# Patient Record
Sex: Male | Born: 1955 | Race: White | Hispanic: No | Marital: Married | Smoking: Former smoker
Health system: Southern US, Community
[De-identification: ages and names within clinical notes are randomized; demographics above are authoritative.]

## PROBLEM LIST (undated history)

## (undated) DIAGNOSIS — Z6832 Body mass index (BMI) 32.0-32.9, adult: Secondary | ICD-10-CM

## (undated) DIAGNOSIS — J301 Allergic rhinitis due to pollen: Secondary | ICD-10-CM

## (undated) DIAGNOSIS — W57XXXA Bitten or stung by nonvenomous insect and other nonvenomous arthropods, initial encounter: Secondary | ICD-10-CM

## (undated) DIAGNOSIS — N529 Male erectile dysfunction, unspecified: Secondary | ICD-10-CM

## (undated) DIAGNOSIS — I10 Essential (primary) hypertension: Secondary | ICD-10-CM

## (undated) DIAGNOSIS — Z148 Genetic carrier of other disease: Secondary | ICD-10-CM

## (undated) DIAGNOSIS — E669 Obesity, unspecified: Secondary | ICD-10-CM

## (undated) DIAGNOSIS — D126 Benign neoplasm of colon, unspecified: Secondary | ICD-10-CM

## (undated) DIAGNOSIS — E781 Pure hyperglyceridemia: Secondary | ICD-10-CM

## (undated) DIAGNOSIS — R931 Abnormal findings on diagnostic imaging of heart and coronary circulation: Secondary | ICD-10-CM

## (undated) HISTORY — PX: OTHER SURGICAL HISTORY: SHX169

## (undated) HISTORY — DX: Genetic carrier of other disease: Z14.8

## (undated) HISTORY — DX: Pure hyperglyceridemia: E78.1

## (undated) HISTORY — DX: Benign neoplasm of colon, unspecified: D12.6

## (undated) HISTORY — DX: Abnormal findings on diagnostic imaging of heart and coronary circulation: R93.1

## (undated) HISTORY — DX: Obesity, unspecified: E66.9

## (undated) HISTORY — DX: Body mass index (BMI) 32.0-32.9, adult: Z68.32

## (undated) HISTORY — DX: Essential (primary) hypertension: I10

## (undated) HISTORY — DX: Male erectile dysfunction, unspecified: N52.9

## (undated) HISTORY — DX: Allergic rhinitis due to pollen: J30.1

---
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
## 1995-12-28 HISTORY — PX: BACK SURGERY: SHX140

## 1999-12-28 HISTORY — PX: HERNIA REPAIR: SHX51

## 2013-09-21 ENCOUNTER — Ambulatory Visit
Admission: RE | Admit: 2013-09-21 | Discharge: 2013-09-21 | Disposition: A | Discharge status: Home / Self Care | Payer: BC Managed Care – PPO | Source: Ambulatory Visit | Attending: Chiropractic Medicine | Admitting: Chiropractic Medicine

## 2013-09-21 ENCOUNTER — Other Ambulatory Visit: Payer: Self-pay | Admitting: Chiropractic Medicine

## 2013-09-21 DIAGNOSIS — M545 Low back pain, unspecified: Secondary | ICD-10-CM

## 2015-12-28 HISTORY — PX: OTHER SURGICAL HISTORY: SHX169

## 2016-06-08 DIAGNOSIS — L711 Rhinophyma: Secondary | ICD-10-CM | POA: Diagnosis not present

## 2016-06-21 DIAGNOSIS — Z01818 Encounter for other preprocedural examination: Secondary | ICD-10-CM | POA: Diagnosis not present

## 2016-06-24 DIAGNOSIS — L711 Rhinophyma: Secondary | ICD-10-CM | POA: Diagnosis not present

## 2016-08-03 DIAGNOSIS — E781 Pure hyperglyceridemia: Secondary | ICD-10-CM | POA: Diagnosis not present

## 2016-08-03 DIAGNOSIS — N529 Male erectile dysfunction, unspecified: Secondary | ICD-10-CM | POA: Diagnosis not present

## 2016-08-03 DIAGNOSIS — I10 Essential (primary) hypertension: Secondary | ICD-10-CM | POA: Diagnosis not present

## 2016-08-03 DIAGNOSIS — Z0000 Encounter for general adult medical examination without abnormal findings: Secondary | ICD-10-CM | POA: Diagnosis not present

## 2017-08-03 DIAGNOSIS — L718 Other rosacea: Secondary | ICD-10-CM | POA: Diagnosis not present

## 2017-08-03 DIAGNOSIS — L821 Other seborrheic keratosis: Secondary | ICD-10-CM | POA: Diagnosis not present

## 2017-08-05 DIAGNOSIS — Z125 Encounter for screening for malignant neoplasm of prostate: Secondary | ICD-10-CM | POA: Diagnosis not present

## 2017-08-05 DIAGNOSIS — Z0000 Encounter for general adult medical examination without abnormal findings: Secondary | ICD-10-CM | POA: Diagnosis not present

## 2017-08-05 DIAGNOSIS — E669 Obesity, unspecified: Secondary | ICD-10-CM | POA: Diagnosis not present

## 2017-08-05 DIAGNOSIS — E781 Pure hyperglyceridemia: Secondary | ICD-10-CM | POA: Diagnosis not present

## 2017-08-05 DIAGNOSIS — I10 Essential (primary) hypertension: Secondary | ICD-10-CM | POA: Diagnosis not present

## 2017-10-06 DIAGNOSIS — K644 Residual hemorrhoidal skin tags: Secondary | ICD-10-CM | POA: Diagnosis not present

## 2017-10-17 DIAGNOSIS — K641 Second degree hemorrhoids: Secondary | ICD-10-CM | POA: Diagnosis not present

## 2017-10-31 DIAGNOSIS — K641 Second degree hemorrhoids: Secondary | ICD-10-CM | POA: Diagnosis not present

## 2017-11-29 DIAGNOSIS — K641 Second degree hemorrhoids: Secondary | ICD-10-CM | POA: Diagnosis not present

## 2018-02-06 DIAGNOSIS — I10 Essential (primary) hypertension: Secondary | ICD-10-CM | POA: Diagnosis not present

## 2018-02-06 DIAGNOSIS — E669 Obesity, unspecified: Secondary | ICD-10-CM | POA: Diagnosis not present

## 2018-02-27 DIAGNOSIS — K641 Second degree hemorrhoids: Secondary | ICD-10-CM | POA: Diagnosis not present

## 2018-07-03 ENCOUNTER — Ambulatory Visit: Payer: Self-pay | Admitting: General Surgery

## 2018-07-03 DIAGNOSIS — K641 Second degree hemorrhoids: Secondary | ICD-10-CM | POA: Diagnosis not present

## 2018-07-03 NOTE — H&P (Signed)
History of Present Illness Leighton Ruff MD; 02/04/7680 3:41 PM) The patient is a 62 year old male who presents with hemorrhoids. 62 year old male who presented to the office with complaints of rectal bleeding. This had been occurring intermittently for the past 2-3 years with bowel movements. Over the past few months he has noticed more consistent bleeding with every bowel movement. This continues for 5-10 minutes and then stops. He reports regular bowel habits and denies any regular straining. His last colonoscopy was in 2014 and he is due again this year. On exam he had obvious inflammation of his right-sided hemorrhoids. He underwent rubber band ligation in Nov 2018 of the right posterior and right anterior hemorrhoids and repeat banding in Dec 2018 and again in March 2019. He has developed recurrent bleeding once again. He reports bleeding with almost every bowel movement that subsides on its own.   Problem List/Past Medical Leighton Ruff, MD; 12/31/7260 3:42 PM) PROLAPSED INTERNAL HEMORRHOIDS, GRADE 2 (K64.1)  Past Surgical History Leighton Ruff, MD; 0/02/5596 3:42 PM) Appendectomy Spinal Surgery - Lower Back Vasectomy  Diagnostic Studies History Leighton Ruff, MD; 03/28/6383 3:42 PM) Colonoscopy 1-5 years ago  Allergies Mammie Lorenzo, LPN; 04/29/6467 0:32 PM) No Known Allergies [02/27/2018]:  Medication History Mammie Lorenzo, LPN; 12/28/2480 5:00 PM) Sildenafil Citrate (20MG  Tablet, Oral) Active. Lisinopril-Hydrochlorothiazide (20-25MG  Tablet, Oral) Active. Fenofibrate (160MG  Tablet, Oral) Active. Multivitamins (Oral) Active. Vitamin D (1000UNIT Tablet, Oral) Active. Medications Reconciled  Social History Leighton Ruff, MD; 03/03/487 3:42 PM) Alcohol use Moderate alcohol use. Caffeine use Carbonated beverages, Tea. Illicit drug use Remotely quit drug use. Tobacco use Former smoker.  Family History Leighton Ruff, MD; 08/04/1693 3:42 PM) Arthritis  Mother. Melanoma Father.  Other Problems Leighton Ruff, MD; 5/0/3888 3:42 PM) Back Pain Hemorrhoids High blood pressure Umbilical Hernia Repair     Review of Systems Leighton Ruff MD; 02/03/33 3:42 PM) General Not Present- Appetite Loss, Chills, Fatigue, Fever, Night Sweats, Weight Gain and Weight Loss. Skin Not Present- Change in Wart/Mole, Dryness, Hives, Jaundice, New Lesions, Non-Healing Wounds, Rash and Ulcer. HEENT Not Present- Earache, Hearing Loss, Hoarseness, Nose Bleed, Oral Ulcers, Ringing in the Ears, Seasonal Allergies, Sinus Pain, Sore Throat, Visual Disturbances, Wears glasses/contact lenses and Yellow Eyes. Respiratory Not Present- Bloody sputum, Chronic Cough, Difficulty Breathing, Snoring and Wheezing. Breast Not Present- Breast Mass, Breast Pain, Nipple Discharge and Skin Changes. Cardiovascular Not Present- Chest Pain, Difficulty Breathing Lying Down, Leg Cramps, Palpitations, Rapid Heart Rate, Shortness of Breath and Swelling of Extremities. Gastrointestinal Present- Hemorrhoids and Rectal Pain. Not Present- Abdominal Pain, Bloating, Bloody Stool, Change in Bowel Habits, Chronic diarrhea, Constipation, Difficulty Swallowing, Excessive gas, Gets full quickly at meals, Indigestion, Nausea and Vomiting. Male Genitourinary Not Present- Blood in Urine, Change in Urinary Stream, Frequency, Impotence, Nocturia, Painful Urination, Urgency and Urine Leakage. Musculoskeletal Present- Back Pain. Not Present- Joint Pain, Joint Stiffness, Muscle Pain, Muscle Weakness and Swelling of Extremities. Neurological Not Present- Decreased Memory, Fainting, Headaches, Numbness, Seizures, Tingling, Tremor, Trouble walking and Weakness. Psychiatric Not Present- Anxiety, Bipolar, Change in Sleep Pattern, Depression, Fearful and Frequent crying. Endocrine Not Present- Cold Intolerance, Excessive Hunger, Hair Changes, Heat Intolerance and New Diabetes. Hematology Not Present- Blood  Thinners, Easy Bruising, Excessive bleeding, Gland problems, HIV and Persistent Infections.  Vitals Claiborne Billings Dockery LPN; 08/27/7914 0:56 PM) 07/03/2018 2:52 PM Weight: 234.6 lb Height: 72in Body Surface Area: 2.28 m Body Mass Index: 31.82 kg/m  Temp.: 61F(Temporal)  Pulse: 97 (Regular)  BP: 126/72 (Sitting, Left Arm, Standard)  Physical Exam Leighton Ruff MD; 05/31/4649 3:42 PM)  General Mental Status-Alert. General Appearance-Not in acute distress. Build & Nutrition-Well nourished. Posture-Normal posture. Gait-Normal.  Head and Neck Head-normocephalic, atraumatic with no lesions or palpable masses. Trachea-midline.  Chest and Lung Exam Chest and lung exam reveals -on auscultation, normal breath sounds, no adventitious sounds and normal vocal resonance.  Cardiovascular Cardiovascular examination reveals -normal heart sounds, regular rate and rhythm with no murmurs and no digital clubbing, cyanosis, edema, increased warmth or tenderness.  Abdomen Inspection Inspection of the abdomen reveals - No Hernias. Palpation/Percussion Palpation and Percussion of the abdomen reveal - Soft, Non Tender, No Rigidity (guarding), No hepatosplenomegaly and No Palpable abdominal masses.  Rectal Anorectal Exam External - skin tag. Internal - normal sphincter tone.  Neurologic Neurologic evaluation reveals -alert and oriented x 3 with no impairment of recent or remote memory, normal attention span and ability to concentrate, normal sensation and normal coordination.  Musculoskeletal Normal Exam - Bilateral-Upper Extremity Strength Normal and Lower Extremity Strength Normal.   Results Leighton Ruff MD; 03/01/4655 3:06 PM) Procedures  Name Value Date Hemorrhoids Procedure Other: Procedure: Anoscopy....Marland KitchenMarland KitchenSurgeon: Marcello Moores....Marland KitchenMarland KitchenAfter the risks and benefits were explained, verbal consent was obtained for above procedure. A medical assistant  chaperone was present thoroughout the entire procedure. ....Marland KitchenMarland KitchenAnesthesia: none....Marland KitchenMarland KitchenDiagnosis: Rectal bleeding....Marland KitchenMarland KitchenFindings: Large grade 2 left lateral internal hemorrhoid, smaller grade 2 right posterior right internal hemorrhoids. Significant external skin tags  Performed: 07/03/2018 3:02 PM    Assessment & Plan Leighton Ruff MD; 07/27/2750 3:06 PM)  PROLAPSED INTERNAL HEMORRHOIDS, GRADE 2 (K64.1) Impression: 62 year old male who presents to the office for evaluation of recurrent rectal bleeding. He is underwent rubber band ligation of his internal hemorrhoids on multiple occasions with no long-lasting effects. We have discussed this in detail. I do not think they were performing another rubber band ligation will add anything to his long-term control of rectal bleeding. I have recommended that he consider a Transhemorrhoidal dearterialization or complete hemorrhoidectomy. We have discussed the risks and benefits of this in detail. We discussed the slightly higher recurrence rate with THD versus higher recovery time with standard hemorrhoidectomy. We discussed the typical postoperative pain with each procedure in detail. We discussed that likely has either one of these procedures would resolve his bleeding are very high. I believe he is in agreement to proceed with transhemrrhoidal dearterialization.

## 2018-08-02 ENCOUNTER — Encounter (HOSPITAL_BASED_OUTPATIENT_CLINIC_OR_DEPARTMENT_OTHER): Payer: Self-pay | Admitting: *Deleted

## 2018-08-02 ENCOUNTER — Other Ambulatory Visit: Payer: Self-pay

## 2018-08-02 NOTE — Progress Notes (Signed)
Npo after midnight, arrive 630 am 08-16-18 [facility]  No meds to take  Needs istat  4 and ekg Has surgery orders in epic Driver wife Domenica Reamer cell [phone]

## 2018-08-10 DIAGNOSIS — L03312 Cellulitis of back [any part except buttock]: Secondary | ICD-10-CM | POA: Diagnosis not present

## 2018-08-14 ENCOUNTER — Encounter (HOSPITAL_BASED_OUTPATIENT_CLINIC_OR_DEPARTMENT_OTHER): Payer: Self-pay | Admitting: *Deleted

## 2018-08-14 NOTE — Progress Notes (Signed)
Pt called and lvm today , needed to add a medication.  Called and spoke w/ pt via phone , he stated he started  taking  Bactrim for infection bug bite, updated pt history and medication list.

## 2018-08-16 ENCOUNTER — Other Ambulatory Visit: Payer: Self-pay

## 2018-08-16 ENCOUNTER — Ambulatory Visit (HOSPITAL_BASED_OUTPATIENT_CLINIC_OR_DEPARTMENT_OTHER): Payer: BLUE CROSS/BLUE SHIELD | Admitting: Anesthesiology

## 2018-08-16 ENCOUNTER — Ambulatory Visit (HOSPITAL_COMMUNITY)
Admission: RE | Admit: 2018-08-16 | Discharge: 2018-08-16 | Disposition: A | Discharge status: Home / Self Care | Payer: BLUE CROSS/BLUE SHIELD | Source: Ambulatory Visit | Attending: General Surgery | Admitting: General Surgery

## 2018-08-16 ENCOUNTER — Encounter (HOSPITAL_BASED_OUTPATIENT_CLINIC_OR_DEPARTMENT_OTHER): Payer: Self-pay

## 2018-08-16 ENCOUNTER — Encounter (HOSPITAL_BASED_OUTPATIENT_CLINIC_OR_DEPARTMENT_OTHER)
Admission: RE | Disposition: A | Discharge status: Home / Self Care | Payer: Self-pay | Source: Ambulatory Visit | Attending: General Surgery

## 2018-08-16 DIAGNOSIS — I10 Essential (primary) hypertension: Secondary | ICD-10-CM | POA: Diagnosis not present

## 2018-08-16 DIAGNOSIS — K642 Third degree hemorrhoids: Secondary | ICD-10-CM | POA: Insufficient documentation

## 2018-08-16 DIAGNOSIS — Z79899 Other long term (current) drug therapy: Secondary | ICD-10-CM | POA: Insufficient documentation

## 2018-08-16 DIAGNOSIS — K641 Second degree hemorrhoids: Secondary | ICD-10-CM | POA: Diagnosis not present

## 2018-08-16 DIAGNOSIS — Z87891 Personal history of nicotine dependence: Secondary | ICD-10-CM | POA: Insufficient documentation

## 2018-08-16 HISTORY — PX: TRANSANAL HEMORRHOIDAL DEARTERIALIZATION: SHX6136

## 2018-08-16 HISTORY — DX: Essential (primary) hypertension: I10

## 2018-08-16 HISTORY — DX: Bitten or stung by nonvenomous insect and other nonvenomous arthropods, initial encounter: W57.XXXA

## 2018-08-16 LAB — POCT I-STAT 4, (NA,K, GLUC, HGB,HCT)
GLUCOSE: 109 mg/dL — AB (ref 70–99)
HEMATOCRIT: 43 % (ref 39.0–52.0)
Hemoglobin: 14.6 g/dL (ref 13.0–17.0)
POTASSIUM: 4.1 mmol/L (ref 3.5–5.1)
Sodium: 135 mmol/L (ref 135–145)

## 2018-08-16 SURGERY — TRANSANAL HEMORRHOIDAL DEARTERIALIZATION
Anesthesia: Monitor Anesthesia Care

## 2018-08-16 MED ORDER — MIDAZOLAM HCL 2 MG/2ML IJ SOLN
INTRAMUSCULAR | Status: DC | PRN
  Administered 2018-08-16 (×2): 1 mg via INTRAVENOUS

## 2018-08-16 MED ORDER — PROPOFOL 10 MG/ML IV BOLUS
INTRAVENOUS | Status: DC | PRN
  Administered 2018-08-16: 50 mg via INTRAVENOUS

## 2018-08-16 MED ORDER — FENTANYL CITRATE (PF) 100 MCG/2ML IJ SOLN
INTRAMUSCULAR | Status: DC | PRN
  Administered 2018-08-16: 50 ug via INTRAVENOUS

## 2018-08-16 MED ORDER — ACETAMINOPHEN 500 MG PO TABS
ORAL_TABLET | ORAL | Status: AC
  Filled 2018-08-16: qty 2

## 2018-08-16 MED ORDER — PROPOFOL 10 MG/ML IV BOLUS
INTRAVENOUS | Status: AC
  Filled 2018-08-16: qty 20

## 2018-08-16 MED ORDER — PROMETHAZINE HCL 25 MG/ML IJ SOLN
6.2500 mg | INTRAMUSCULAR | Status: DC | PRN
  Filled 2018-08-16: qty 1

## 2018-08-16 MED ORDER — ACETAMINOPHEN 500 MG PO TABS
1000.0000 mg | ORAL_TABLET | ORAL | Status: AC
  Administered 2018-08-16: 1000 mg via ORAL
  Filled 2018-08-16: qty 2

## 2018-08-16 MED ORDER — LIDOCAINE 5 % EX OINT
TOPICAL_OINTMENT | CUTANEOUS | Status: DC | PRN
  Administered 2018-08-16: 1

## 2018-08-16 MED ORDER — HYDROMORPHONE HCL 1 MG/ML IJ SOLN
0.2500 mg | INTRAMUSCULAR | Status: DC | PRN
  Filled 2018-08-16: qty 0.5

## 2018-08-16 MED ORDER — FENTANYL CITRATE (PF) 100 MCG/2ML IJ SOLN
INTRAMUSCULAR | Status: AC
  Filled 2018-08-16: qty 2

## 2018-08-16 MED ORDER — GABAPENTIN 300 MG PO CAPS
300.0000 mg | ORAL_CAPSULE | ORAL | Status: AC
Start: 2018-08-16 — End: 2018-08-16
  Administered 2018-08-16: 300 mg via ORAL
  Filled 2018-08-16: qty 1

## 2018-08-16 MED ORDER — BUPIVACAINE LIPOSOME 1.3 % IJ SUSP
INTRAMUSCULAR | Status: DC | PRN
  Administered 2018-08-16: 20 mL

## 2018-08-16 MED ORDER — DIAZEPAM 5 MG PO TABS: 5.0000 mg | ORAL_TABLET | Freq: Three times a day (TID) | ORAL | 0 refills | Status: AC | PRN

## 2018-08-16 MED ORDER — OXYCODONE HCL 5 MG PO TABS: 5.0000 mg | ORAL_TABLET | Freq: Four times a day (QID) | ORAL | 0 refills | Status: DC | PRN

## 2018-08-16 MED ORDER — LACTATED RINGERS IV SOLN
INTRAVENOUS | Status: DC
  Administered 2018-08-16 (×2): via INTRAVENOUS
  Filled 2018-08-16: qty 1000

## 2018-08-16 MED ORDER — SUGAMMADEX SODIUM 200 MG/2ML IV SOLN
INTRAVENOUS | Status: AC
  Filled 2018-08-16: qty 2

## 2018-08-16 MED ORDER — OXYCODONE HCL 5 MG/5ML PO SOLN
5.0000 mg | Freq: Once | ORAL | Status: DC | PRN
  Filled 2018-08-16: qty 5

## 2018-08-16 MED ORDER — BUPIVACAINE-EPINEPHRINE 0.5% -1:200000 IJ SOLN
INTRAMUSCULAR | Status: DC | PRN
  Administered 2018-08-16: 30 mL

## 2018-08-16 MED ORDER — CELECOXIB 200 MG PO CAPS
ORAL_CAPSULE | ORAL | Status: AC
  Filled 2018-08-16: qty 1

## 2018-08-16 MED ORDER — CELECOXIB 200 MG PO CAPS
200.0000 mg | ORAL_CAPSULE | ORAL | Status: AC
Start: 2018-08-16 — End: 2018-08-16
  Administered 2018-08-16: 200 mg via ORAL
  Filled 2018-08-16: qty 1

## 2018-08-16 MED ORDER — PROPOFOL 500 MG/50ML IV EMUL
INTRAVENOUS | Status: DC | PRN
  Administered 2018-08-16: 200 ug/kg/min via INTRAVENOUS

## 2018-08-16 MED ORDER — PROPOFOL 500 MG/50ML IV EMUL
INTRAVENOUS | Status: AC
  Filled 2018-08-16: qty 50

## 2018-08-16 MED ORDER — LIDOCAINE 2% (20 MG/ML) 5 ML SYRINGE
INTRAMUSCULAR | Status: AC
  Filled 2018-08-16: qty 5

## 2018-08-16 MED ORDER — MIDAZOLAM HCL 2 MG/2ML IJ SOLN
INTRAMUSCULAR | Status: AC
  Filled 2018-08-16: qty 2

## 2018-08-16 MED ORDER — BUPIVACAINE LIPOSOME 1.3 % IJ SUSP
20.0000 mL | Freq: Once | INTRAMUSCULAR | Status: DC
  Filled 2018-08-16: qty 20

## 2018-08-16 MED ORDER — OXYCODONE HCL 5 MG PO TABS
5.0000 mg | ORAL_TABLET | Freq: Once | ORAL | Status: DC | PRN
  Filled 2018-08-16: qty 1

## 2018-08-16 MED ORDER — LIDOCAINE 2% (20 MG/ML) 5 ML SYRINGE
INTRAMUSCULAR | Status: DC | PRN
  Administered 2018-08-16: 50 mg via INTRAVENOUS

## 2018-08-16 MED ORDER — GABAPENTIN 300 MG PO CAPS
ORAL_CAPSULE | ORAL | Status: AC
  Filled 2018-08-16: qty 1

## 2018-08-16 SURGICAL SUPPLY — 33 items
BLADE HEX COATED 2.75 (ELECTRODE) ×2 IMPLANT
BRIEF STRETCH FOR OB PAD LRG (UNDERPADS AND DIAPERS) ×2 IMPLANT
COVER BACK TABLE 60X90IN (DRAPES) ×2 IMPLANT
DECANTER SPIKE VIAL GLASS SM (MISCELLANEOUS) IMPLANT
DRAPE LG THREE QUARTER DISP (DRAPES) ×2 IMPLANT
DRAPE UNDERBUTTOCKS STRL (DRAPE) ×2 IMPLANT
DRSG PAD ABDOMINAL 8X10 ST (GAUZE/BANDAGES/DRESSINGS) ×2 IMPLANT
ELECT REM PT RETURN 9FT ADLT (ELECTROSURGICAL) ×2
ELECTRODE REM PT RTRN 9FT ADLT (ELECTROSURGICAL) ×1 IMPLANT
GAUZE 4X4 16PLY RFD (DISPOSABLE) ×2 IMPLANT
GAUZE SPONGE 4X4 12PLY STRL (GAUZE/BANDAGES/DRESSINGS) ×2 IMPLANT
GLOVE BIO SURGEON STRL SZ 6.5 (GLOVE) ×2 IMPLANT
GOWN STRL REUS W/TWL 2XL LVL3 (GOWN DISPOSABLE) ×2 IMPLANT
GOWN STRL REUS W/TWL XL LVL3 (GOWN DISPOSABLE) ×2 IMPLANT
HEMOSTAT SURGICEL 4X8 (HEMOSTASIS) IMPLANT
KIT SLIDE ONE PROLAPS HEMORR (KITS) IMPLANT
KIT TURNOVER CYSTO (KITS) ×2 IMPLANT
LEGGING LITHOTOMY PAIR STRL (DRAPES) ×2 IMPLANT
LUBRICANT JELLY K Y 4OZ (MISCELLANEOUS) ×2 IMPLANT
NEEDLE HYPO 22GX1.5 SAFETY (NEEDLE) ×2 IMPLANT
PACK BASIN DAY SURGERY FS (CUSTOM PROCEDURE TRAY) ×2 IMPLANT
PAD ABD 8X10 STRL (GAUZE/BANDAGES/DRESSINGS) ×2 IMPLANT
PAD ARMBOARD 7.5X6 YLW CONV (MISCELLANEOUS) IMPLANT
PENCIL BUTTON HOLSTER BLD 10FT (ELECTRODE) ×2 IMPLANT
SPONGE HEMORRHOID 8X3CM (HEMOSTASIS) IMPLANT
SUT CHROMIC 2 0 SH (SUTURE) ×2 IMPLANT
SUT CHROMIC 3 0 SH 27 (SUTURE) ×2 IMPLANT
SUT VIC AB 2-0 UR6 27 (SUTURE) IMPLANT
SYR CONTROL 10ML LL (SYRINGE) ×2 IMPLANT
TOWEL OR 17X24 6PK STRL BLUE (TOWEL DISPOSABLE) ×4 IMPLANT
TRAY DSU PREP LF (CUSTOM PROCEDURE TRAY) ×2 IMPLANT
TUBE CONNECTING 12X1/4 (SUCTIONS) ×2 IMPLANT
YANKAUER SUCT BULB TIP NO VENT (SUCTIONS) ×2 IMPLANT

## 2018-08-16 NOTE — H&P (Signed)
The patient is a 62 year old male who presents with hemorrhoids. 62 year old male who presented to the office with complaints of rectal bleeding. This had been occurring intermittently for the past 2-3 years with bowel movements. Over the past few months he has noticed more consistent bleeding with every bowel movement. This continues for 5-10 minutes and then stops. He reports regular bowel habits and denies any regular straining. His last colonoscopy was in 2014 and he is due again this year. On exam he had obvious inflammation of his right-sided hemorrhoids. He underwent rubber band ligation in Nov 2018 of the right posterior and right anterior hemorrhoids and repeat banding in Dec 2018 and again in March 2019. He has developed recurrent bleeding once again. He reports bleeding with almost every bowel movement that subsides on its own.   Problem List/Past Medical Leighton Ruff, MD; 0/08/6282 3:42 PM) PROLAPSED INTERNAL HEMORRHOIDS, GRADE 2 (K64.1)  Past Surgical History Leighton Ruff, MD; 06/01/2946 3:42 PM) Appendectomy Spinal Surgery - Lower Back Vasectomy  Diagnostic Studies History Leighton Ruff, MD; 05/31/4649 3:42 PM) Colonoscopy 1-5 years ago  Allergies Mammie Lorenzo, LPN; 03/01/4655 8:12 PM) No Known Allergies [02/27/2018]:  Medication History Mammie Lorenzo, LPN; 06/30/1699 1:74 PM) Sildenafil Citrate (20MG  Tablet, Oral) Active. Lisinopril-Hydrochlorothiazide (20-25MG  Tablet, Oral) Active. Fenofibrate (160MG  Tablet, Oral) Active. Multivitamins (Oral) Active. Vitamin D (1000UNIT Tablet, Oral) Active. Medications Reconciled  Social History Leighton Ruff, MD; 08/30/4966 3:42 PM) Alcohol use Moderate alcohol use. Caffeine use Carbonated beverages, Tea. Illicit drug use Remotely quit drug use. Tobacco use Former smoker.  Family History Leighton Ruff, MD; 05/04/1637 3:42 PM) Arthritis Mother. Melanoma Father.  Other Problems Leighton Ruff, MD;  04/01/6598 3:42 PM) Back Pain Hemorrhoids High blood pressure Umbilical Hernia Repair     Review of Systems  General Not Present- Appetite Loss, Chills, Fatigue, Fever, Night Sweats, Weight Gain and Weight Loss. Skin Not Present- Change in Wart/Mole, Dryness, Hives, Jaundice, New Lesions, Non-Healing Wounds, Rash and Ulcer. HEENT Not Present- Earache, Hearing Loss, Hoarseness, Nose Bleed, Oral Ulcers, Ringing in the Ears, Seasonal Allergies, Sinus Pain, Sore Throat, Visual Disturbances, Wears glasses/contact lenses and Yellow Eyes. Respiratory Not Present- Bloody sputum, Chronic Cough, Difficulty Breathing, Snoring and Wheezing. Breast Not Present- Breast Mass, Breast Pain, Nipple Discharge and Skin Changes. Cardiovascular Not Present- Chest Pain, Difficulty Breathing Lying Down, Leg Cramps, Palpitations, Rapid Heart Rate, Shortness of Breath and Swelling of Extremities. Gastrointestinal Present- Hemorrhoids and Rectal Pain. Not Present- Abdominal Pain, Bloating, Bloody Stool, Change in Bowel Habits, Chronic diarrhea, Constipation, Difficulty Swallowing, Excessive gas, Gets full quickly at meals, Indigestion, Nausea and Vomiting. Male Genitourinary Not Present- Blood in Urine, Change in Urinary Stream, Frequency, Impotence, Nocturia, Painful Urination, Urgency and Urine Leakage. Musculoskeletal Present- Back Pain. Not Present- Joint Pain, Joint Stiffness, Muscle Pain, Muscle Weakness and Swelling of Extremities. Neurological Not Present- Decreased Memory, Fainting, Headaches, Numbness, Seizures, Tingling, Tremor, Trouble walking and Weakness. Psychiatric Not Present- Anxiety, Bipolar, Change in Sleep Pattern, Depression, Fearful and Frequent crying. Endocrine Not Present- Cold Intolerance, Excessive Hunger, Hair Changes, Heat Intolerance and New Diabetes. Hematology Not Present- Blood Thinners, Easy Bruising, Excessive bleeding, Gland problems, HIV and Persistent Infections.  BP (!)  146/81   Pulse 88   Temp 98.4 F (36.9 C) (Oral)   Resp 16   Ht 6' (1.829 m)   Wt 105.5 kg   SpO2 97%   BMI 31.55 kg/m     Physical Exam  General Mental Status-Alert. General Appearance-Not in acute distress. Build & Nutrition-Well  nourished. Posture-Normal posture. Gait-Normal.  Head and Neck Head-normocephalic, atraumatic with no lesions or palpable masses. Trachea-midline.  Chest and Lung Exam Chest and lung exam reveals -on auscultation, normal breath sounds, no adventitious sounds and normal vocal resonance.  Cardiovascular Cardiovascular examination reveals -normal heart sounds, regular rate and rhythm with no murmurs and no digital clubbing, cyanosis, edema, increased warmth or tenderness.  Abdomen Inspection Inspection of the abdomen reveals - No Hernias. Palpation/Percussion Palpation and Percussion of the abdomen reveal - Soft, Non Tender, No Rigidity (guarding), No hepatosplenomegaly and No Palpable abdominal masses.  Rectal Anorectal Exam External - skin tag. Internal - normal sphincter tone.  Neurologic Neurologic evaluation reveals -alert and oriented x 3 with no impairment of recent or remote memory, normal attention span and ability to concentrate, normal sensation and normal coordination.  Musculoskeletal Normal Exam - Bilateral-Upper Extremity Strength Normal and Lower Extremity Strength Normal.   Other: Procedure: Anoscopy....Marland KitchenMarland KitchenSurgeon: Marcello Moores....Marland KitchenMarland KitchenAfter the risks and benefits were explained, verbal consent was obtained for above procedure. A medical assistant chaperone was present thoroughout the entire procedure. ....Marland KitchenMarland KitchenAnesthesia: none....Marland KitchenMarland KitchenDiagnosis: Rectal bleeding....Marland KitchenMarland KitchenFindings: Large grade 2 left lateral internal hemorrhoid, smaller grade 2 right posterior right internal hemorrhoids. Significant external skin tags  Performed: 07/03/2018 3:02 PM    Assessment & Plan Leighton Ruff MD;  0/12/69 3:06 PM)  PROLAPSED INTERNAL HEMORRHOIDS, GRADE 2 (K64.1) Impression: 62 year old male who presents to the office for evaluation of recurrent rectal bleeding. He is underwent rubber band ligation of his internal hemorrhoids on multiple occasions with no long-lasting effects. We have discussed this in detail. I do not think they were performing another rubber band ligation will add anything to his long-term control of rectal bleeding. I have recommended that he consider a Transhemorrhoidal dearterialization or complete hemorrhoidectomy. We have discussed the risks and benefits of this in detail. We discussed the slightly higher recurrence rate with THD versus higher recovery time with standard hemorrhoidectomy. We discussed the typical postoperative pain with each procedure in detail. We discussed that likely has either one of these procedures would resolve his bleeding are very high. I believe he is in agreement to proceed with transhemrrhoidal dearterialization.

## 2018-08-16 NOTE — Anesthesia Preprocedure Evaluation (Addendum)
Anesthesia Evaluation  Patient identified by MRN, date of birth, ID band Patient awake    Reviewed: Allergy & Precautions, NPO status , Patient's Chart, lab work & pertinent test results  Airway Mallampati: II  TM Distance: >3 FB Neck ROM: Full    Dental no notable dental hx.    Pulmonary former smoker,    Pulmonary exam normal breath sounds clear to auscultation       Cardiovascular hypertension, Pt. on medications Normal cardiovascular exam Rhythm:Regular Rate:Normal  ECG: NSR, rate 85   Neuro/Psych negative neurological ROS  negative psych ROS   GI/Hepatic negative GI ROS, Neg liver ROS,   Endo/Other  negative endocrine ROS  Renal/GU negative Renal ROS     Musculoskeletal negative musculoskeletal ROS (+)   Abdominal (+) + obese,   Peds  Hematology negative hematology ROS (+)   Anesthesia Other Findings GRADE 2 INTERNAL HEMORRHOIDS  BLEEDING  Reproductive/Obstetrics                            Anesthesia Physical Anesthesia Plan  ASA: II  Anesthesia Plan: MAC   Post-op Pain Management:    Induction: Intravenous  PONV Risk Score and Plan: 1 and Propofol infusion and Treatment may vary due to age or medical condition  Airway Management Planned: Simple Face Mask  Additional Equipment:   Intra-op Plan:   Post-operative Plan:   Informed Consent: I have reviewed the patients History and Physical, chart, labs and discussed the procedure including the risks, benefits and alternatives for the proposed anesthesia with the patient or authorized representative who has indicated his/her understanding and acceptance.   Dental advisory given  Plan Discussed with: CRNA  Anesthesia Plan Comments:         Anesthesia Quick Evaluation

## 2018-08-16 NOTE — Transfer of Care (Signed)
Immediate Anesthesia Transfer of Care Note  Patient: Travis Gomez  Procedure(s) Performed: Procedure(s) (LRB): TRANSANAL HEMORRHOIDAL DEARTERIALIZATION (N/A)  Patient Location: PACU  Anesthesia Type: MAC  Level of Consciousness: awake, alert , oriented and patient cooperative  Airway & Oxygen Therapy: Patient Spontanous Breathing and Patient connected to face mask oxygen  Post-op Assessment: Report given to PACU RN and Post -op Vital signs reviewed and stable  Post vital signs: Reviewed and stable  Complications: No apparent anesthesia complications  Last Vitals:  Vitals Value Taken Time  BP    Temp    Pulse 95 08/16/2018  9:16 AM  Resp    SpO2 97 % 08/16/2018  9:16 AM  Vitals shown include unvalidated device data.  Last Pain:  Vitals:   08/16/18 0715  TempSrc:   PainSc: 0-No pain

## 2018-08-16 NOTE — Anesthesia Postprocedure Evaluation (Signed)
Anesthesia Post Note  Patient: Travis Gomez  Procedure(s) Performed: TRANSANAL HEMORRHOIDAL DEARTERIALIZATION (N/A )     Patient location during evaluation: PACU Anesthesia Type: MAC Level of consciousness: awake and alert Pain management: pain level controlled Vital Signs Assessment: post-procedure vital signs reviewed and stable Respiratory status: spontaneous breathing, nonlabored ventilation, respiratory function stable and patient connected to nasal cannula oxygen Cardiovascular status: stable and blood pressure returned to baseline Postop Assessment: no apparent nausea or vomiting Anesthetic complications: no    Last Vitals:  Vitals:   08/16/18 0945 08/16/18 1100  BP: (!) 142/82 133/90  Pulse: 74 70  Resp: 14 14  Temp:  36.8 C  SpO2: 92% 100%    Last Pain:  Vitals:   08/16/18 1100  TempSrc:   PainSc: 0-No pain                 Calvin Chura P Broderic Bara

## 2018-08-16 NOTE — Op Note (Signed)
08/16/2018  9:22 AM  PATIENT:  Alben Deeds  62 y.o. male  Patient Care Team: Lavone Orn, MD as PCP - General (Internal Medicine)  PRE-OPERATIVE DIAGNOSIS:  BLEEDING GRADE 2 INTERNAL HEMORRHOIDS BLEEDING  POST-OPERATIVE DIAGNOSIS:  BLEEDING GRADE 2 INTERNAL HEMORRHOIDS BLEEDING  PROCEDURE: TRANSANAL HEMORRHOIDAL DEARTERIALIZATION   Surgeon(s): Leighton Ruff, MD  ASSISTANT: none   ANESTHESIA:   local and MAC  EBL: 38m  Total I/O In: 700 [I.V.:700] Out: 5 [Blood:5]  DRAINS: none   SPECIMEN:  No Specimen  DISPOSITION OF SPECIMEN:  N/A  COUNTS:  YES  PLAN OF CARE: Discharge to home after PACU  PATIENT DISPOSITION:  PACU - hemodynamically stable.  INDICATION: Patient with grade 2 bleeding internal hemorrhoids   OR FINDINGS: Grade 2 hemorrhoids, Grade 3 L lateral hemorrhoid  Description: Informed consent was confirmed. Patient underwent general anesthesia without difficulty. Patient was placed into lithotomy positioning.  The perianal region was prepped and draped in sterile fashion. Surgical time out confirmed or plan.  I did digital rectal examination and then transitioned over to anoscopy to get a sense of the anatomy.  I switched over to the TBeartooth Billings Clinicfiberoptically lit Doppler anocope.   Using the anoscope, I identified the approximate location of the arterial hemorrhoidal vessels coming in in the classic hexagonal anatomical pattern (right posterior/lateral/anterior, left posterior /lateral/anterior).    I proceeded to ligate the hemorrhoidal arteries. I used a 2-0 Vicryl suture on a UR-6 needle in a figure-of-eight fashion over the signal around 6 cm proximal to the anal verge. I then ran that stitch longitudinally more distally to the dentate line. I then tied that stitch down to cause a hemorrhoidopexy. I did that for all 6 locations.    I placed additional stiches on the left and right side for additional prolapse.  At completion of this, all hemorrhoids were reduced  into the rectum.  There was no more prolapse. External anatomy looked normal.  I repeated anoscopy and examination.   Hemostasis was good. Patient is being extubated go to recovery room.  I am about to discuss the patient's status to the family.   I have reviewed the NNeoshofor this patient.

## 2018-08-16 NOTE — Discharge Instructions (Addendum)
ANORECTAL SURGERY: POST OP INSTRUCTIONS 1. Take your usually prescribed home medications unless otherwise directed. 2. DIET: During the first few hours after surgery sip on some liquids until you are able to urinate.  It is normal to not urinate for several hours after this surgery.  If you feel uncomfortable, please contact the office for instructions.  After you are able to urinate,you may eat, if you feel like it.  Follow a light bland diet the first 24 hours after arrival home, such as soup, liquids, crackers, etc.  Be sure to include lots of fluids daily (6-8 glasses).  Avoid fast food or heavy meals, as your are more likely to get nauseated.  Eat a low fat diet the next few days after surgery.  Limit caffeine intake to 1-2 servings a day. 3. PAIN CONTROL: a. Pain is best controlled by a usual combination of several different methods TOGETHER: i. Muscle relaxation 1.  Soak in a warm bath (or Sitz bath) three times a day and after bowel movements.  Continue to do this until all pain is resolved. 2. Take the muscle relaxer (Valium) every 6 hours for the first 2 days after surgery  ii. Over the counter pain medication iii. Prescription pain medication b. Most patients will experience some swelling and discomfort in the anus/rectal area and incisions.  Heat such as warm towels, sitz baths, warm baths, etc to help relax tight/sore spots and speed recovery.  Some people prefer to use ice, especially in the first couple days after surgery, as it may decrease the pain and swelling, or alternate between ice & heat.  Experiment to what works for you.  Swelling and bruising can take several weeks to resolve.  Pain can take even longer to completely resolve. c. It is helpful to take an over-the-counter pain medication regularly for the first few weeks.  Choose one of the following that works best for you: i. Naproxen (Aleve, etc)  Two 220mg  tabs twice a day  Ibuprofen (Advil, etc) Three 200mg  tabs four times a  day (every meal & bedtime) d. A  prescription for pain medication (such as percocet, oxycodone, hydrocodone, etc) should be given to you upon discharge.  Take your pain medication as prescribed.  i. If you are having problems/concerns with the prescription medicine (does not control pain, nausea, vomiting, rash, itching, etc), please call us [phone] to see if we need to switch you to a different pain medicine that will work better for you and/or control your side effect better. ii. If you need a refill on your pain medication, please contact your pharmacy.  They will contact our office to request authorization. Prescriptions will not be filled after 5 pm or on week-ends. 4. KEEP YOUR BOWELS REGULAR and AVOID CONSTIPATION a. The goal is one to two soft bowel movements a day.  You should at least have a bowel movement every other day. b. Avoid getting constipated.  Between the surgery and the pain medications, it is common to experience some constipation. This can be very painful after rectal surgery.  Increasing fluid intake and taking a fiber supplement (such as Metamucil, Citrucel, FiberCon, etc) 1-2 times a day regularly will usually help prevent this problem from occurring.  A stool softener like colace is also recommended.  This can be purchased over the counter at your pharmacy.  You can take it up to 3 times a day.  If you do not have a bowel movement after 24 hrs since your surgery,  take one does of milk of magnesia.  If you still haven't had a bowel movement 8-12 hours after that dose, take another dose.  If you don't have a bowel movement 48 hrs after surgery, purchase a Fleets enema from the drug store and administer gently per package instructions.  If you still are having trouble with your bowel movements after that, please call the office for further instructions. c. If you develop diarrhea or have many loose bowel movements, simplify your diet to bland foods & liquids for a few days.   Stop any stool softeners and decrease your fiber supplement.  Switching to mild anti-diarrheal medications (Kayopectate, Pepto Bismol) can help.  If this worsens or does not improve, please call us.  5. Wound Care a. Remove your bandages before your first bowel movement or 8 hours after surgery.     b. Remove any wound packing material at this tim,e as well.  You do not need to repack the wound unless instructed otherwise.  Wear an absorbent pad or soft cotton gauze in your underwear to catch any drainage and help keep the area clean. You should change this every 2-3 hours while awake. c. Keep the area clean and dry.  Bathe / shower every day, especially after bowel movements.  Keep the area clean by showering / bathing over the incision / wound.   It is okay to soak an open wound to help wash it.  Wet wipes or showers / gentle washing after bowel movements is often less traumatic than regular toilet paper. d. Dennis Bast may have some styrofoam-like soft packing in the rectum which will come out with the first bowel movement.  e. You will often notice bleeding with bowel movements.  This should slow down by the end of the first week of surgery f. Expect some drainage.  This should slow down, too, by the end of the first week of surgery.  Wear an absorbent pad or soft cotton gauze in your underwear until the drainage stops. g. Do Not sit on a rubber or pillow ring.  This can make you symptoms worse.  You may sit on a soft pillow if needed.  6. ACTIVITIES as tolerated:   a. You may resume regular (light) daily activities beginning the next day--such as daily self-care, walking, climbing stairs--gradually increasing activities as tolerated.  If you can walk 30 minutes without difficulty, it is safe to try more intense activity such as jogging, treadmill, bicycling, low-impact aerobics, swimming, etc. b. Save the most intensive and strenuous activity for last such as sit-ups, heavy lifting, contact sports, etc   Refrain from any heavy lifting or straining until you are off narcotics for pain control.   c. You may drive when you are no longer taking prescription pain medication, you can comfortably sit for long periods of time, and you can safely maneuver your car and apply brakes. d. Dennis Bast may have sexual intercourse when it is comfortable.  7. FOLLOW UP in our office a. Please call [facility] at [phone] to set up an appointment to see your surgeon in the office for a follow-up appointment approximately 3-4 weeks after your surgery. b. Make sure that you call for this appointment the day you arrive home to insure a convenient appointment time. 10. IF YOU HAVE DISABILITY OR FAMILY LEAVE FORMS, BRING THEM TO THE OFFICE FOR PROCESSING.  DO NOT GIVE THEM TO YOUR DOCTOR.        Next dose Motrin,Aleve after 2:30pm  WHEN TO CALL us [phone]: 1. Poor pain control 2. Reactions / problems with new medications (rash/itching, nausea, etc)  3. Fever over 101.5 F (38.5 C) 4. Inability to urinate 5. Nausea and/or vomiting 6. Worsening swelling or bruising 7. Continued bleeding from incision. 8. Increased pain, redness, or drainage from the incision  The clinic staff is available to answer your questions during regular business hours (8:30am-5pm).  Please dont hesitate to call and ask to speak to one of our nurses for clinical concerns.   A surgeon from [facility] is always on call at the hospitals   If you have a medical emergency, go to the nearest emergency room or call 911.    [facility], [place], [place], [place],   [address] ? MAIN: [phone] ? TOLL FREE: [phone] ? FAX [phone] [email or web address]   Post Anesthesia Home Care Instructions  Activity: Get plenty of rest for the remainder of the day. A responsible individual must stay with you for 24 hours following the procedure.  For the next 24 hours, DO  NOT: -Drive a car -Paediatric nurse -Drink alcoholic beverages -Take any medication unless instructed by your physician -Make any legal decisions or sign important papers.  Meals: Start with liquid foods such as gelatin or soup. Progress to regular foods as tolerated. Avoid greasy, spicy, heavy foods. If nausea and/or vomiting occur, drink only clear liquids until the nausea and/or vomiting subsides. Call your physician if vomiting continues.  Special Instructions/Symptoms: Your throat may feel dry or sore from the anesthesia or the breathing tube placed in your throat during surgery. If this causes discomfort, gargle with warm salt water. The discomfort should disappear within 24 hours.  If you had a scopolamine patch placed behind your ear for the management of post- operative nausea and/or vomiting:  1. The medication in the patch is effective for 72 hours, after which it should be removed.  Wrap patch in a tissue and discard in the trash. Wash hands thoroughly with soap and water. 2. You may remove the patch earlier than 72 hours if you experience unpleasant side effects which may include dry mouth, dizziness or visual disturbances. 3. Avoid touching the patch. Wash your hands with soap and water after contact with the patch.

## 2018-08-17 ENCOUNTER — Encounter (HOSPITAL_BASED_OUTPATIENT_CLINIC_OR_DEPARTMENT_OTHER): Payer: Self-pay | Admitting: General Surgery

## 2018-09-28 DIAGNOSIS — I10 Essential (primary) hypertension: Secondary | ICD-10-CM | POA: Diagnosis not present

## 2018-09-28 DIAGNOSIS — E669 Obesity, unspecified: Secondary | ICD-10-CM | POA: Diagnosis not present

## 2018-09-28 DIAGNOSIS — N529 Male erectile dysfunction, unspecified: Secondary | ICD-10-CM | POA: Diagnosis not present

## 2018-09-28 DIAGNOSIS — Z148 Genetic carrier of other disease: Secondary | ICD-10-CM | POA: Diagnosis not present

## 2018-09-28 DIAGNOSIS — E781 Pure hyperglyceridemia: Secondary | ICD-10-CM | POA: Diagnosis not present

## 2018-09-28 DIAGNOSIS — Z0000 Encounter for general adult medical examination without abnormal findings: Secondary | ICD-10-CM | POA: Diagnosis not present

## 2018-09-28 DIAGNOSIS — Z23 Encounter for immunization: Secondary | ICD-10-CM | POA: Diagnosis not present

## 2018-11-10 DIAGNOSIS — R3129 Other microscopic hematuria: Secondary | ICD-10-CM | POA: Diagnosis not present

## 2019-01-29 DIAGNOSIS — D485 Neoplasm of uncertain behavior of skin: Secondary | ICD-10-CM | POA: Diagnosis not present

## 2019-01-29 DIAGNOSIS — L820 Inflamed seborrheic keratosis: Secondary | ICD-10-CM | POA: Diagnosis not present

## 2019-01-29 DIAGNOSIS — L821 Other seborrheic keratosis: Secondary | ICD-10-CM | POA: Diagnosis not present

## 2019-04-03 DIAGNOSIS — N529 Male erectile dysfunction, unspecified: Secondary | ICD-10-CM | POA: Diagnosis not present

## 2019-04-03 DIAGNOSIS — E781 Pure hyperglyceridemia: Secondary | ICD-10-CM | POA: Diagnosis not present

## 2019-04-03 DIAGNOSIS — J301 Allergic rhinitis due to pollen: Secondary | ICD-10-CM | POA: Diagnosis not present

## 2019-04-03 DIAGNOSIS — I10 Essential (primary) hypertension: Secondary | ICD-10-CM | POA: Diagnosis not present

## 2019-08-08 DIAGNOSIS — L821 Other seborrheic keratosis: Secondary | ICD-10-CM | POA: Diagnosis not present

## 2019-10-11 DIAGNOSIS — I10 Essential (primary) hypertension: Secondary | ICD-10-CM | POA: Diagnosis not present

## 2019-10-11 DIAGNOSIS — E781 Pure hyperglyceridemia: Secondary | ICD-10-CM | POA: Diagnosis not present

## 2019-10-11 DIAGNOSIS — Z0000 Encounter for general adult medical examination without abnormal findings: Secondary | ICD-10-CM | POA: Diagnosis not present

## 2019-10-11 DIAGNOSIS — Z23 Encounter for immunization: Secondary | ICD-10-CM | POA: Diagnosis not present

## 2019-10-11 DIAGNOSIS — N529 Male erectile dysfunction, unspecified: Secondary | ICD-10-CM | POA: Diagnosis not present

## 2019-10-11 DIAGNOSIS — Z125 Encounter for screening for malignant neoplasm of prostate: Secondary | ICD-10-CM | POA: Diagnosis not present

## 2019-10-17 ENCOUNTER — Other Ambulatory Visit: Payer: Self-pay | Admitting: Internal Medicine

## 2019-10-17 DIAGNOSIS — E781 Pure hyperglyceridemia: Secondary | ICD-10-CM

## 2019-10-19 ENCOUNTER — Ambulatory Visit
Admission: RE | Admit: 2019-10-19 | Discharge: 2019-10-19 | Disposition: A | Discharge status: Home / Self Care | Payer: BLUE CROSS/BLUE SHIELD | Source: Ambulatory Visit | Attending: Internal Medicine | Admitting: Internal Medicine

## 2019-10-19 DIAGNOSIS — E781 Pure hyperglyceridemia: Secondary | ICD-10-CM

## 2019-10-26 DIAGNOSIS — R931 Abnormal findings on diagnostic imaging of heart and coronary circulation: Secondary | ICD-10-CM | POA: Diagnosis not present

## 2019-10-30 DIAGNOSIS — Z03818 Encounter for observation for suspected exposure to other biological agents ruled out: Secondary | ICD-10-CM | POA: Diagnosis not present

## 2019-12-24 DIAGNOSIS — E781 Pure hyperglyceridemia: Secondary | ICD-10-CM | POA: Diagnosis not present

## 2019-12-24 DIAGNOSIS — Z5181 Encounter for therapeutic drug level monitoring: Secondary | ICD-10-CM | POA: Diagnosis not present

## 2019-12-31 ENCOUNTER — Ambulatory Visit (INDEPENDENT_AMBULATORY_CARE_PROVIDER_SITE_OTHER): Payer: BC Managed Care – PPO | Admitting: Cardiology

## 2019-12-31 ENCOUNTER — Other Ambulatory Visit: Payer: Self-pay

## 2019-12-31 ENCOUNTER — Encounter: Payer: Self-pay | Admitting: Cardiology

## 2019-12-31 VITALS — BP 130/80 | HR 77 | Ht 72.0 in | Wt 235.0 lb

## 2019-12-31 DIAGNOSIS — I10 Essential (primary) hypertension: Secondary | ICD-10-CM | POA: Diagnosis not present

## 2019-12-31 DIAGNOSIS — I2510 Atherosclerotic heart disease of native coronary artery without angina pectoris: Secondary | ICD-10-CM

## 2019-12-31 DIAGNOSIS — E782 Mixed hyperlipidemia: Secondary | ICD-10-CM

## 2019-12-31 NOTE — Patient Instructions (Signed)
Medication Instructions:  Please restart your Crestor 10 mg daily.  Continue all other medications as listed.  *If you need a refill on your cardiac medications before your next appointment, please call your pharmacy*  Testing/Procedures: Your physician has requested that you have a lexiscan myoview. For further information please visit [email or web address]. Please follow instruction sheet, as given.  Follow-Up: At [facility], you and your health needs are our priority.  As part of our continuing mission to provide you with exceptional heart care, we have created designated Provider Care Teams.  These Care Teams include your primary Cardiologist (physician) and Advanced Practice Providers (APPs -  Physician Assistants and Nurse Practitioners) who all work together to provide you with the care you need, when you need it.  Your next appointment:   3 month(s)  The format for your next appointment:   In Person  Provider:   You may see Candee Furbish, MD or one of the following Advanced Practice Providers on your designated Care Team:    Truitt Merle, NP  Cecilie Kicks, NP  Kathyrn Drown, NP   Thank you for choosing [facility]!!

## 2019-12-31 NOTE — Progress Notes (Signed)
Cardiology Office Note:    Date:  12/31/2019   ID:  Travis Gomez, DOB [birth year], MRN [id number]  PCP:  Lavone Orn, MD  Cardiologist:  Candee Furbish, MD  Electrophysiologist:  None   Referring MD: Lavone Orn, MD     History of Present Illness:    Travis Gomez is a 64 y.o. male here for the evaluation of coronary artery calcification at the request of Dr. Laurann Montana.  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  After an elevated coronary calcium score he was started on rosuvastatin 10 mg a day as well as aspirin 81 mg a day, however he is not currently taking the Crestor until he talk to me.  After taking 1 or 2 doses of Crestor 10 mg he stated that he felt achiness like a viral syndrome.  He stopped and it went away.  His calcium score was 99th percentile, score was 3846.Marland Kitchen  He is a former smoker quit in 1980, only smoked for 2 years.  Brother and sister both have hemochromatosis.  Maternal grandfather had a heart attack at age 27.  And his mother's brother also had heart issues.  His father died at age 24 from an aneurysm.  I was able to personally review his CT with him to show him the pictures of the dense calcification in his LAD as well as RCA.  He is not describing any significant anginal symptoms, no chest discomfort.  Minimal shortness of breath when traversing 3 levels of stairs at work.  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  Plans to retire next year.    Past Medical History:  Diagnosis Date  . Adenomatous polyp of colon   . Body mass index (BMI) of 32.0-32.9 in adult   . Bug bite    08-14-2018  per pt on vacation and on return 07-29-2018 noted bug bite and became red and very tender, pt urgent care started him antibiotic  . ED (erectile dysfunction)   . Elevated coronary artery calcium score   . Hemochromatosis carrier   . Hyperglyceridemia   . Hypertension   . Hypertension, essential   .  Obesity, unspecified   . Seasonal allergic rhinitis due to pollen     Past Surgical History:  Procedure Laterality Date  . APPENDECTOMY  1971  . BACK SURGERY  1997   microdiscectomt L 3 to L 4   . HEMORRHOIDAL BANDING    . HERNIA REPAIR  [id number]   umbilical   . NASAL RHINOPHYMA  2017  . TRANSANAL HEMORRHOIDAL DEARTERIALIZATION N/A 08/16/2018   Procedure: TRANSANAL HEMORRHOIDAL DEARTERIALIZATION;  Surgeon: Leighton Ruff, MD;  Location[facility];  Service: General;  Laterality: N/A;    Current Medications: Current Meds  Medication Sig  . aspirin EC 81 MG tablet Take 81 mg by mouth daily.  . cholecalciferol (VITAMIN D) 1000 units tablet Take 1,000 Units by mouth daily.  . fenofibrate 160 MG tablet Take 160 mg by mouth daily.  Marland Kitchen lisinopril-hydrochlorothiazide (PRINZIDE,ZESTORETIC) 20-25 MG tablet Take 1 tablet by mouth daily.  . Multiple Vitamins-Minerals (MULTIVITAMIN WITH MINERALS) tablet Take 1 tablet by mouth daily.  . rosuvastatin (CRESTOR) 10 MG tablet Take 10 mg by mouth daily.  . sildenafil (REVATIO) 20 MG tablet 20 mg. TAKE 2-5 TABLET BY MOUTH AS NEEDED     Allergies:   Patient has no known allergies.   Social History   Socioeconomic History  . Marital status: Married  Spouse name: Not on file  . Number of children: Not on file  . Years of education: Not on file  . Highest education level: Not on file  Occupational History  . Not on file  Tobacco Use  . Smoking status: Former Smoker    Packs/day: 3.00  . Smokeless tobacco: Never Used  . Tobacco comment: quit 1980  Substance and Sexual Activity  . Alcohol use: Yes    Comment: occ weekends  . Drug use: Never  . Sexual activity: Not on file  Other Topics Concern  . Not on file  Social History Narrative  . Not on file   Social Determinants of Health   Financial Resource Strain:   . Difficulty of Paying Living Expenses: Not on file  Food Insecurity:   . Worried About Charity fundraiser in  the Last Year: Not on file  . Ran Out of Food in the Last Year: Not on file  Transportation Needs:   . Lack of Transportation (Medical): Not on file  . Lack of Transportation (Non-Medical): Not on file  Physical Activity:   . Days of Exercise per Week: Not on file  . Minutes of Exercise per Session: Not on file  Stress:   . Feeling of Stress : Not on file  Social Connections:   . Frequency of Communication with Friends and Family: Not on file  . Frequency of Social Gatherings with Friends and Family: Not on file  . Attends Religious Services: Not on file  . Active Member of Clubs or Organizations: Not on file  . Attends Archivist Meetings: Not on file  . Marital Status: Not on file     Family History: The patient's family history includes Aneurysm in his father; Healthy in his brother, daughter, and son; Heart attack in his maternal grandfather; Hemochromatosis in his brother, mother, and sister; Liver disease in his mother.  ROS:   Please see the history of present illness.    No fevers chills nausea vomiting syncope bleeding all other systems reviewed and are negative.  EKGs/Labs/Other Studies Reviewed:    The following studies were reviewed today: Calcium score reviewed, office notes from Dr. Lupita Shutter reviewed  EKG: Normal sinus rhythm 77 with no other significant abnormalities.  Recent Labs: No results found for requested labs within last 8760 hours.  Recent Lipid Panel No results found for: CHOL, TRIG, HDL, CHOLHDL, VLDL, LDLCALC, LDLDIRECT  Physical Exam:    VS:  BP 130/80   Pulse 77   Ht 6' (1.829 m)   Wt 235 lb (106.6 kg)   SpO2 96%   BMI 31.87 kg/m     Wt Readings from Last 3 Encounters:  12/31/19 235 lb (106.6 kg)  08/16/18 232 lb 9.6 oz (105.5 kg)     GEN:  Well nourished, well developed in no acute distress HEENT: Normal NECK: No JVD; No carotid bruits LYMPHATICS: No lymphadenopathy CARDIAC: RRR, no murmurs, rubs, gallops RESPIRATORY:   Clear to auscultation without rales, wheezing or rhonchi  ABDOMEN: Soft, non-tender, non-distended MUSCULOSKELETAL:  No edema; No deformity  SKIN: Warm and dry NEUROLOGIC:  Alert and oriented x 3 PSYCHIATRIC:  Normal affect   ASSESSMENT:    1. Coronary artery calcification seen on CAT scan   2. Atherosclerosis of native coronary artery of native heart without angina pectoris   3. Mixed hyperlipidemia   4. Essential hypertension    PLAN:    In order of problems listed above:  Highly elevated coronary  calcium score/coronary atherosclerosis -99 percentile at 3846. -Agree with statin initiation 10 mg of Crestor to begin with then increase to 20 mg for high intensity dose.  Aspirin 81 mg for prevention makes sense. -He will message me if he begins to have any difficulties with the Crestor once again.  I think next step would be for him to take Crestor alone however he will likely need the fibrate because of his highly elevated triglycerides, 700-900 range at times.  If necessary, we can refer him to the pharmacy team lipid clinic. -Given his calcium score of greater than 400, we will proceed with pharmacologic stress test.  Mixed hyperlipidemia -Currently on fibrate, agree with starting Crestor again.  LDL 105 HDL 43 triglycerides 342 total cholesterol 216 and review of outside lab work.  Outside basic metabolic profile also shows creatinine of 0.96 and liver function of 15 ALT.  Essential hypertension -Currently well controlled on medications reviewed as above per Dr. Laurann Montana.  ED -Noted this about a year ago.  Sildenafil effective.  Medication Adjustments/Labs and Tests Ordered: Current medicines are reviewed at length with the patient today.  Concerns regarding medicines are outlined above.  Orders Placed This Encounter  Procedures  . Myocardial Perfusion Imaging  . EKG 12-Lead   No orders of the defined types were placed in this encounter.   Patient Instructions  Medication  Instructions:  Please restart your Crestor 10 mg daily.  Continue all other medications as listed.  *If you need a refill on your cardiac medications before your next appointment, please call your pharmacy*  Testing/Procedures: Your physician has requested that you have a lexiscan myoview. For further information please visit [email or web address]. Please follow instruction sheet, as given.  Follow-Up: At [facility], you and your health needs are our priority.  As part of our continuing mission to provide you with exceptional heart care, we have created designated Provider Care Teams.  These Care Teams include your primary Cardiologist (physician) and Advanced Practice Providers (APPs -  Physician Assistants and Nurse Practitioners) who all work together to provide you with the care you need, when you need it.  Your next appointment:   3 month(s)  The format for your next appointment:   In Person  Provider:   You may see Candee Furbish, MD or one of the following Advanced Practice Providers on your designated Care Team:    Truitt Merle, NP  Cecilie Kicks, NP  Kathyrn Drown, NP   Thank you for choosing [facility]!!        Signed, Candee Furbish, MD  12/31/2019 10:20 AM    [place]

## 2020-01-03 DIAGNOSIS — E782 Mixed hyperlipidemia: Secondary | ICD-10-CM | POA: Diagnosis not present

## 2020-01-03 DIAGNOSIS — Z5181 Encounter for therapeutic drug level monitoring: Secondary | ICD-10-CM | POA: Diagnosis not present

## 2020-01-03 DIAGNOSIS — I10 Essential (primary) hypertension: Secondary | ICD-10-CM | POA: Diagnosis not present

## 2020-01-09 ENCOUNTER — Telehealth (HOSPITAL_COMMUNITY): Payer: Self-pay | Admitting: *Deleted

## 2020-01-09 ENCOUNTER — Encounter (HOSPITAL_COMMUNITY): Payer: BC Managed Care – PPO

## 2020-01-09 NOTE — Telephone Encounter (Signed)
Patient given detailed instructions per Myocardial Perfusion Study Information Sheet for the test on 01/11/20.  Patient notified to arrive 15 minutes early and that it is imperative to arrive on time for appointment to keep from having the test rescheduled.  If you need to cancel or reschedule your appointment, please call the office within 24 hours of your appointment. . Patient verbalized understanding. Travis Gomez    

## 2020-01-11 ENCOUNTER — Other Ambulatory Visit: Payer: Self-pay

## 2020-01-11 ENCOUNTER — Ambulatory Visit (HOSPITAL_COMMUNITY): Payer: BC Managed Care – PPO | Attending: Internal Medicine

## 2020-01-11 DIAGNOSIS — I2510 Atherosclerotic heart disease of native coronary artery without angina pectoris: Secondary | ICD-10-CM | POA: Diagnosis not present

## 2020-01-11 LAB — MYOCARDIAL PERFUSION IMAGING
LV dias vol: 92 mL (ref 62–150)
LV sys vol: 38 mL
Peak HR: 105 {beats}/min
Rest HR: 69 {beats}/min
SDS: 0
SRS: 0
SSS: 0
TID: 1.01

## 2020-01-11 MED ORDER — TECHNETIUM TC 99M TETROFOSMIN IV KIT
10.4000 | PACK | Freq: Once | INTRAVENOUS | Status: AC | PRN
  Filled 2020-01-11: qty 11

## 2020-01-11 MED ORDER — TECHNETIUM TC 99M TETROFOSMIN IV KIT
32.2000 | PACK | Freq: Once | INTRAVENOUS | Status: AC | PRN
  Filled 2020-01-11: qty 33

## 2020-01-11 MED ORDER — REGADENOSON 0.4 MG/5ML IV SOLN: 0.4000 mg | Freq: Once | INTRAVENOUS | Status: AC

## 2020-02-26 IMAGING — CT CT HEART SCORING
3 series · 13 of 20 positions shown, 15 images · non-contrast
Comparison: No priors.

CLINICAL DATA: 63-year-old Caucasian male with history of
hyperlipidemia. Family history of heart disease.

EXAM:
CT HEART FOR CALCIUM SCORING
TECHNIQUE: CT heart was performed using prospective ECG gating.
A non-contrast exam for calcium scoring was performed.
Note that this exam targets the heart and the chest was not imaged
in its entirety.

[Series 2: calcium scoring 2.00 qr36 bestdiast 70% · axial · 0.48mm/px · z∈[+1522,+1578]mm · 3 of 70 slices shown]
[im 14/70  vessel]
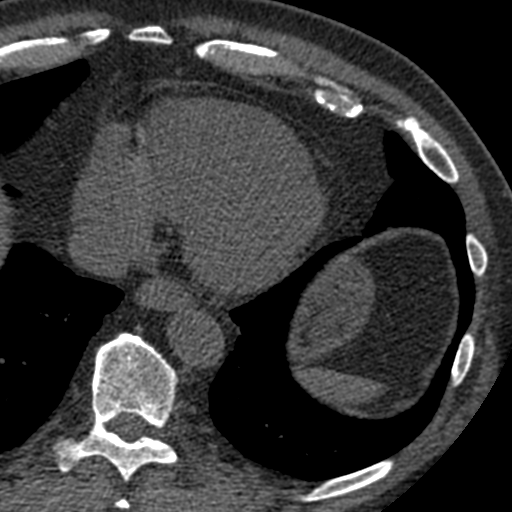
[im 28/70  vessel]
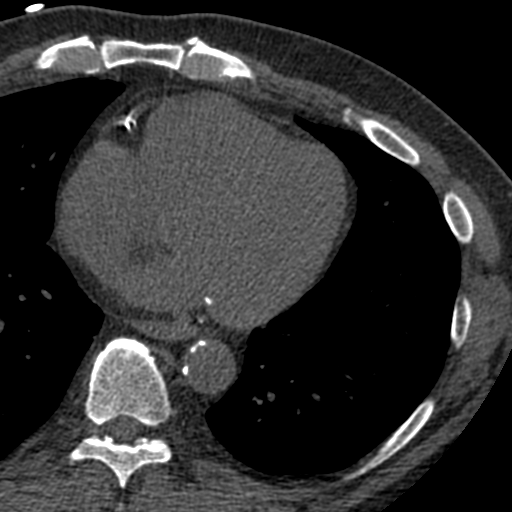
[im 42/70  vessel]
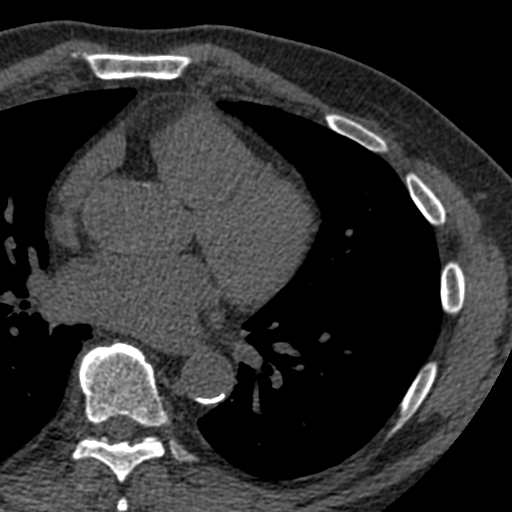

[Series 3: calcium scoring 2.00 br40 bestdiast 70% fov · axial · 0.54mm/px · z∈[+1519,+1609]mm · 5 of 69 slices shown, 7 images]
[im 12/69  vessel]
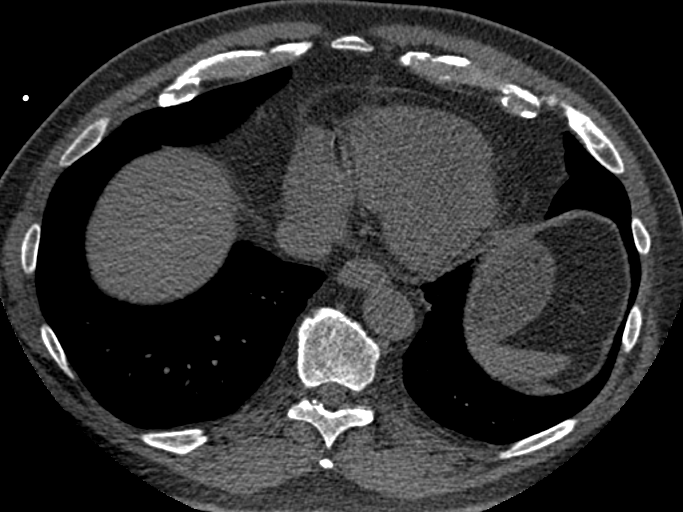
[im 12/69  lung]
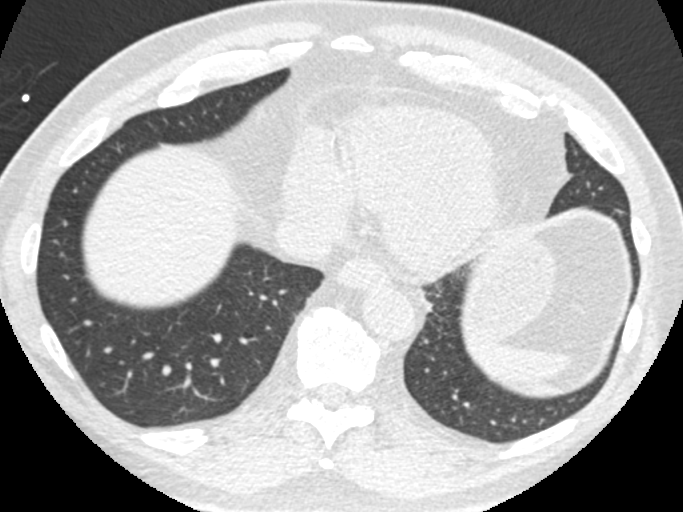
[im 23/69  vessel]
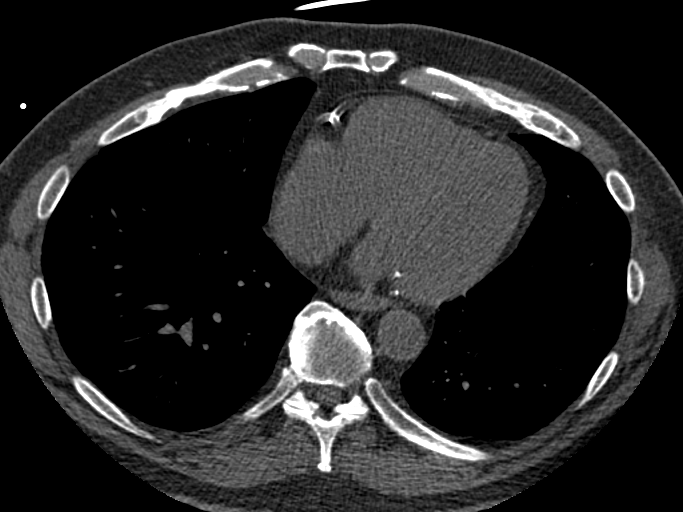
[im 35/69  vessel]
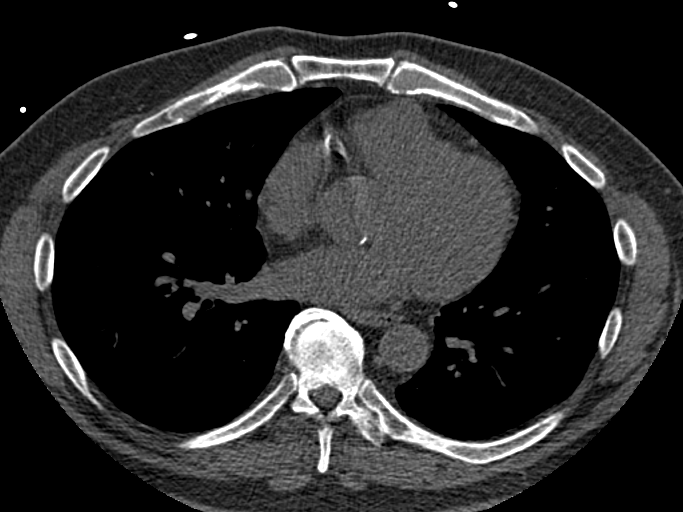
[im 46/69  vessel]
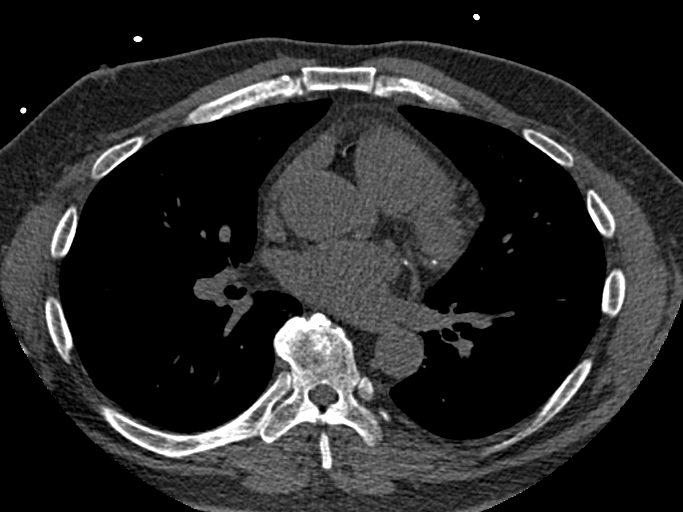
[im 57/69  vessel]
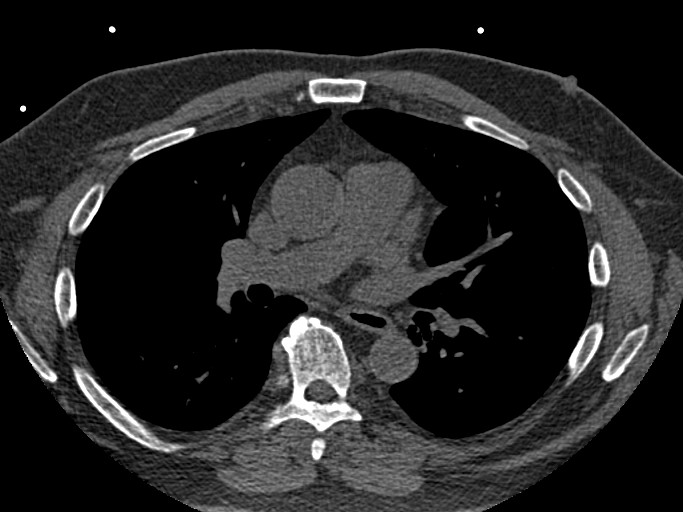
[im 57/69  lung]
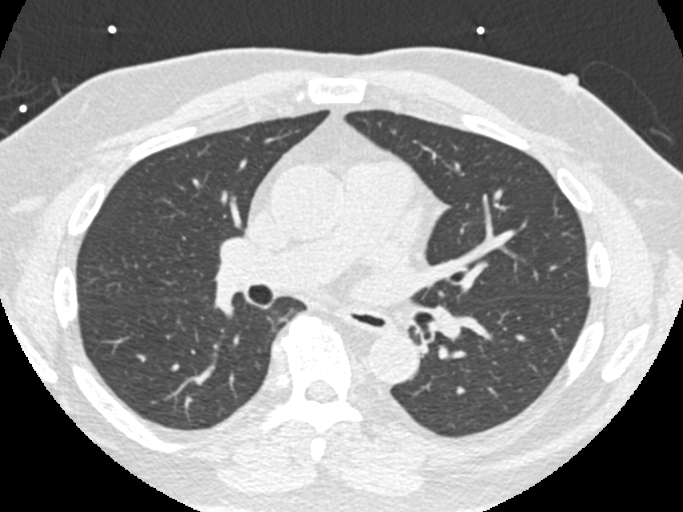

[Series 9: calcium scoring 2.00 br60 bestdiast 70% fov · axial · 0.52mm/px · z∈[+1519,+1609]mm · 5 of 69 slices shown]
[im 12/69  vessel]
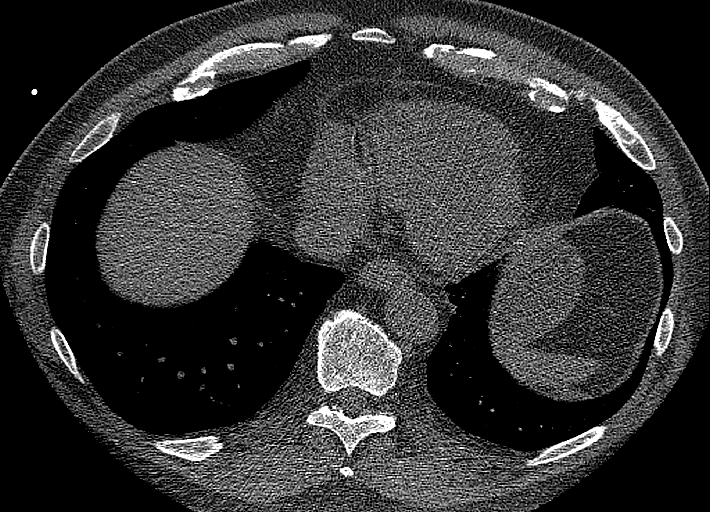
[im 23/69  vessel]
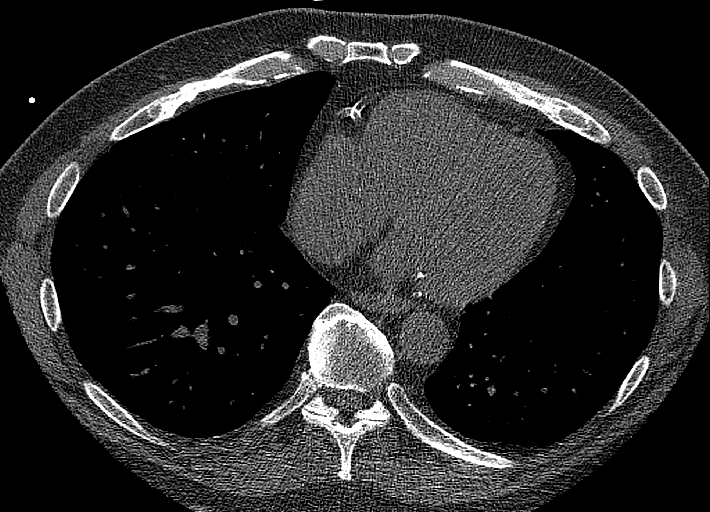
[im 35/69  vessel]
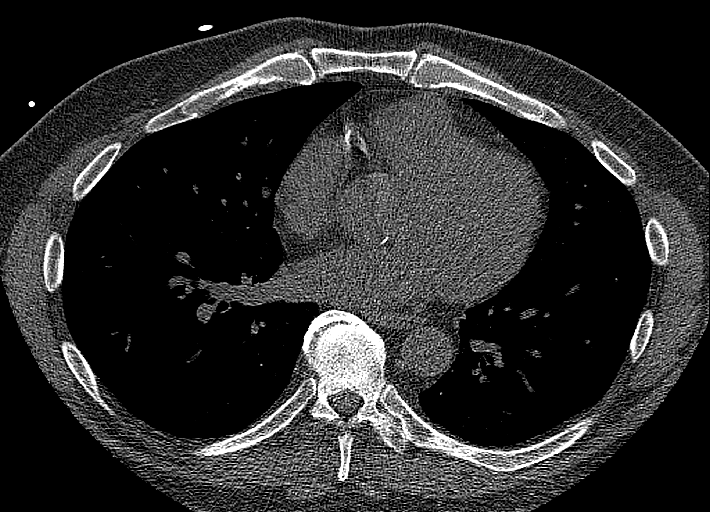
[im 46/69  vessel]
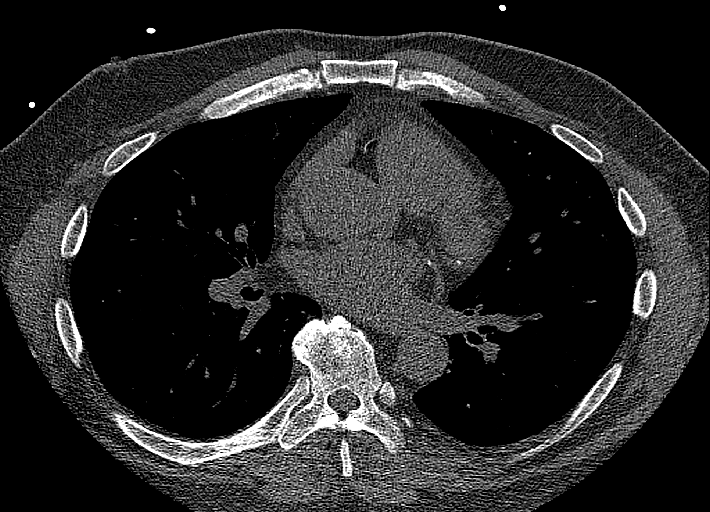
[im 57/69  vessel]
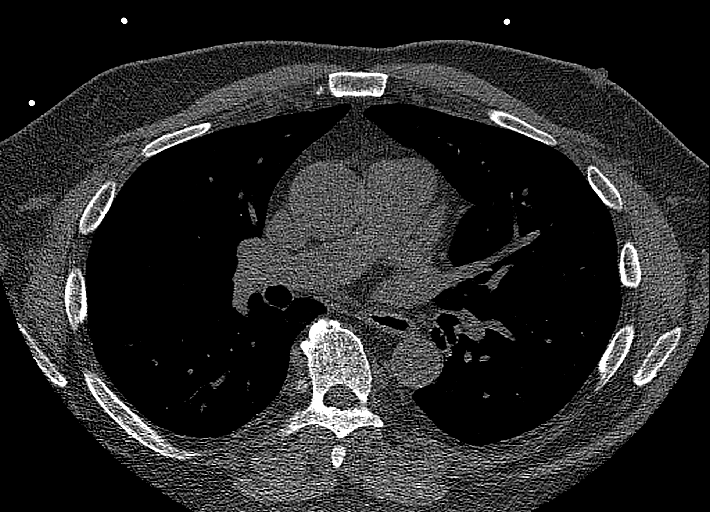

[13 of 20 positions shown; findings below may reference images not displayed]

FINDINGS: Technical quality: Good.

CORONARY CALCIUM

Total Agatston Score: 3,846

[HOSPITAL] percentile:  99th

OTHER FINDINGS:

Aortic atherosclerosis. Within the visualized portions of the thorax
there are no suspicious appearing pulmonary nodules or masses, there
is no acute consolidative airspace disease, no pleural effusions, no
pneumothorax and no lymphadenopathy. Visualized portions of the
upper abdomen are unremarkable. There are no aggressive appearing
lytic or blastic lesions noted in the visualized portions of the
skeleton.
IMPRESSION: 1. Patient's total coronary artery calcium score is 3,846 which is
98th percentile for patient's of matched age, gender and
race/ethnicity.
2.  Aortic Atherosclerosis ([id number]-[id number]).

## 2020-03-03 DIAGNOSIS — E782 Mixed hyperlipidemia: Secondary | ICD-10-CM | POA: Diagnosis not present

## 2020-04-15 DIAGNOSIS — I10 Essential (primary) hypertension: Secondary | ICD-10-CM | POA: Diagnosis not present

## 2020-04-15 DIAGNOSIS — E782 Mixed hyperlipidemia: Secondary | ICD-10-CM | POA: Diagnosis not present

## 2020-04-15 DIAGNOSIS — Z86010 Personal history of colonic polyps: Secondary | ICD-10-CM | POA: Diagnosis not present

## 2020-04-15 DIAGNOSIS — R931 Abnormal findings on diagnostic imaging of heart and coronary circulation: Secondary | ICD-10-CM | POA: Diagnosis not present

## 2020-04-15 DIAGNOSIS — Z23 Encounter for immunization: Secondary | ICD-10-CM | POA: Diagnosis not present

## 2020-04-17 ENCOUNTER — Ambulatory Visit (INDEPENDENT_AMBULATORY_CARE_PROVIDER_SITE_OTHER): Payer: BLUE CROSS/BLUE SHIELD | Admitting: Cardiology

## 2020-04-17 ENCOUNTER — Encounter: Payer: Self-pay | Admitting: Cardiology

## 2020-04-17 ENCOUNTER — Other Ambulatory Visit: Payer: Self-pay

## 2020-04-17 VITALS — BP 122/70 | HR 80 | Ht 72.0 in | Wt 232.0 lb

## 2020-04-17 DIAGNOSIS — I2510 Atherosclerotic heart disease of native coronary artery without angina pectoris: Secondary | ICD-10-CM | POA: Diagnosis not present

## 2020-04-17 DIAGNOSIS — E782 Mixed hyperlipidemia: Secondary | ICD-10-CM | POA: Diagnosis not present

## 2020-04-17 DIAGNOSIS — R011 Cardiac murmur, unspecified: Secondary | ICD-10-CM

## 2020-04-17 DIAGNOSIS — I10 Essential (primary) hypertension: Secondary | ICD-10-CM | POA: Diagnosis not present

## 2020-04-17 MED ORDER — ROSUVASTATIN CALCIUM 5 MG PO TABS: 5.0000 mg | ORAL_TABLET | Freq: Every day | ORAL | 3 refills | Status: DC

## 2020-04-17 NOTE — Patient Instructions (Signed)
Medication Instructions:   DECREASE YOUR CRESTOR (ROSUVASTATIN) TO 5 MG BY MOUTH DAILY  *If you need a refill on your cardiac medications before your next appointment, please call your pharmacy*   Testing/Procedures:  Your physician has requested that you have an echocardiogram. Echocardiography is a painless test that uses sound waves to create images of your heart. It provides your doctor with information about the size and shape of your heart and how well your heart's chambers and valves are working. This procedure takes approximately one hour. There are no restrictions for this procedure.   Follow-Up: At [facility], you and your health needs are our priority.  As part of our continuing mission to provide you with exceptional heart care, we have created designated Provider Care Teams.  These Care Teams include your primary Cardiologist (physician) and Advanced Practice Providers (APPs -  Physician Assistants and Nurse Practitioners) who all work together to provide you with the care you need, when you need it.  We recommend signing up for the patient portal called "MyChart".  Sign up information is provided on this After Visit Summary.  MyChart is used to connect with patients for Virtual Visits (Telemedicine).  Patients are able to view lab/test results, encounter notes, upcoming appointments, etc.  Non-urgent messages can be sent to your provider as well.   To learn more about what you can do with MyChart, go to [email or web address].    Your next appointment:   12 month(s)  The format for your next appointment:   In Person  Provider:   Candee Furbish, MD

## 2020-04-17 NOTE — Progress Notes (Signed)
Cardiology Office Note:    Date:  04/17/2020   ID:  Travis Gomez, DOB [birth year], MRN [id number]  PCP:  Lavone Orn, MD  Cardiologist:  Candee Furbish, MD  Electrophysiologist:  None   Referring MD: Lavone Orn, MD     History of Present Illness:    Travis Gomez is a 64 y.o. male here for the follow-up of coronary artery calcification and hypertriglyceridemia baseline 800 triglycerides.  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  Calcium score was 99th percentile at 3800.  Brother and sister both have hemochromatosis.  Maternal grandfather had heart attack at age 29.  Father died age 42 from an aneurysm.  After taking 1 or 2 doses of Crestor 10 he stated that he felt some achiness like a viral syndrome.  He stopped it and it went away.  Previously showed him the pictures of the CT scan with dense calcification in the LAD and RCA.  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  Enjoying retired life.  Recently came back from [place].  Vaccinated for [employer].  Overall doing well without any anginal symptoms.    Past Medical History:  Diagnosis Date  . Adenomatous polyp of colon   . Body mass index (BMI) of 32.0-32.9 in adult   . Bug bite    08-14-2018  per pt on vacation and on return 07-29-2018 noted bug bite and became red and very tender, pt urgent care started him antibiotic  . ED (erectile dysfunction)   . Elevated coronary artery calcium score   . Hemochromatosis carrier   . Hyperglyceridemia   . Hypertension   . Hypertension, essential   . Obesity, unspecified   . Seasonal allergic rhinitis due to pollen     Past Surgical History:  Procedure Laterality Date  . APPENDECTOMY  1971  . BACK SURGERY  1997   microdiscectomt L 3 to L 4   . HEMORRHOIDAL BANDING    . HERNIA REPAIR  99991111   umbilical   . NASAL RHINOPHYMA  2017  . TRANSANAL HEMORRHOIDAL DEARTERIALIZATION N/A 08/16/2018   Procedure: TRANSANAL HEMORRHOIDAL DEARTERIALIZATION;  Surgeon: Leighton Ruff, MD;   [facility];  Service: General;  Laterality: N/A;    Current Medications: Current Meds  Medication Sig  . aspirin EC 81 MG tablet Take 81 mg by mouth daily.  . cholecalciferol (VITAMIN D) 1000 units tablet Take 1,000 Units by mouth daily.  . fenofibrate 160 MG tablet Take 160 mg by mouth daily.  Marland Kitchen lisinopril-hydrochlorothiazide (PRINZIDE,ZESTORETIC) 20-25 MG tablet Take 1 tablet by mouth daily.  . Multiple Vitamins-Minerals (MULTIVITAMIN WITH MINERALS) tablet Take 1 tablet by mouth daily.  . sildenafil (REVATIO) 20 MG tablet 20 mg. TAKE 2-5 TABLET BY MOUTH AS NEEDED  . [DISCONTINUED] rosuvastatin (CRESTOR) 10 MG tablet Take 10 mg by mouth daily.     Allergies:   Patient has no known allergies.   Social History   Socioeconomic History  . Marital status: Married    Spouse name: Not on file  . Number of children: Not on file  . Years of education: Not on file  . Highest education level: Not on file  Occupational History  . Not on file  Tobacco Use  . Smoking status: Former Smoker    Packs/day: 3.00  . Smokeless tobacco: Never Used  . Tobacco comment: quit 1980  Substance and Sexual Activity  . Alcohol use: Yes    Comment: occ weekends  . Drug use: Never  . Sexual activity: Not on  file  Other Topics Concern  . Not on file  Social History Narrative  . Not on file   Social Determinants of Health   Financial Resource Strain:   . Difficulty of Paying Living Expenses:   Food Insecurity:   . Worried About Charity fundraiser in the Last Year:   . Arboriculturist in the Last Year:   Transportation Needs:   . Film/video editor (Medical):   Marland Kitchen Lack of Transportation (Non-Medical):   Physical Activity:   . Days of Exercise per Week:   . Minutes of Exercise per Session:   Stress:   . Feeling of Stress :   Social Connections:   . Frequency of Communication with Friends and Family:   . Frequency of Social Gatherings with Friends and Family:   .  Attends Religious Services:   . Active Member of Clubs or Organizations:   . Attends Archivist Meetings:   Marland Kitchen Marital Status:      Family History: The patient's family history includes Aneurysm in his father; Healthy in his brother, daughter, and son; Heart attack in his maternal grandfather; Hemochromatosis in his brother, mother, and sister; Liver disease in his mother.  ROS:   Please see the history of present illness.     All other systems reviewed and are negative.  EKGs/Labs/Other Studies Reviewed:    The following studies were reviewed today: Nuclear stress test reviewed with him-low risk no ischemia  EKG:  EKG is not ordered today.    Recent Labs: No results found for requested labs within last 8760 hours.  Recent Lipid Panel No results found for: CHOL, TRIG, HDL, CHOLHDL, VLDL, LDLCALC, LDLDIRECT  Physical Exam:    VS:  BP 122/70   Pulse 80   Ht 6' (1.829 m)   Wt 232 lb (105.2 kg)   SpO2 98%   BMI 31.46 kg/m     Wt Readings from Last 3 Encounters:  04/17/20 232 lb (105.2 kg)  01/11/20 235 lb (106.6 kg)  12/31/19 235 lb (106.6 kg)     GEN:  Well nourished, well developed in no acute distress HEENT: Normal NECK: No JVD; No carotid bruits LYMPHATICS: No lymphadenopathy CARDIAC: RRR, 1/6 systolic murmur, rubs, gallops RESPIRATORY:  Clear to auscultation without rales, wheezing or rhonchi  ABDOMEN: Soft, non-tender, non-distended MUSCULOSKELETAL:  No edema; No deformity  SKIN: Warm and dry NEUROLOGIC:  Alert and oriented x 3 PSYCHIATRIC:  Normal affect   ASSESSMENT:    1. Coronary artery calcification seen on CAT scan   2. Atherosclerosis of native coronary artery of native heart without angina pectoris   3. Essential hypertension   4. Mixed hyperlipidemia   5. Murmur    PLAN:    In order of problems listed above:  Elevated coronary calcium score/coronary atherosclerosis -99th percentile 3800 -Crestor 5 mg.  Triglycerides 180 now on  fibrate as well.  States that his LDL was 87 at Dr. Delene Ruffini.  Stress test reviewed with him. -Not having any symptoms.  Continue with aggressive medical management.  Explained to him clearly that if symptoms do occur that we would have low threshold for coronary angiography.  Heart murmur -We will check echocardiogram.  Sister recently had valve surgery he states.  Mixed hyperlipidemia -Continue with Crestor 5 mg.  Seems to be tolerating this well.  Had some trouble with higher dosing.  Also on fibrate.  Continue.  Triglycerides are the best they have been in a while.  Continue to advocate for dietary weight loss.  Essential hypertension -Currently controlled on medications as above.  Dr. Laurann Montana.  Medication Adjustments/Labs and Tests Ordered: Current medicines are reviewed at length with the patient today.  Concerns regarding medicines are outlined above.  Orders Placed This Encounter  Procedures  . ECHOCARDIOGRAM COMPLETE   Meds ordered this encounter  Medications  . rosuvastatin (CRESTOR) 5 MG tablet    Sig: Take 1 tablet (5 mg total) by mouth daily.    Dispense:  90 tablet    Refill:  3    decreased    Patient Instructions  Medication Instructions:   DECREASE YOUR CRESTOR (ROSUVASTATIN) TO 5 MG BY MOUTH DAILY  *If you need a refill on your cardiac medications before your next appointment, please call your pharmacy*   Testing/Procedures:  Your physician has requested that you have an echocardiogram. Echocardiography is a painless test that uses sound waves to create images of your heart. It provides your doctor with information about the size and shape of your heart and how well your heart's chambers and valves are working. This procedure takes approximately one hour. There are no restrictions for this procedure.   Follow-Up: At [facility], you and your health needs are our priority.  As part of our continuing mission to provide you with exceptional heart care, we  have created designated Provider Care Teams.  These Care Teams include your primary Cardiologist (physician) and Advanced Practice Providers (APPs -  Physician Assistants and Nurse Practitioners) who all work together to provide you with the care you need, when you need it.  We recommend signing up for the patient portal called "MyChart".  Sign up information is provided on this After Visit Summary.  MyChart is used to connect with patients for Virtual Visits (Telemedicine).  Patients are able to view lab/test results, encounter notes, upcoming appointments, etc.  Non-urgent messages can be sent to your provider as well.   To learn more about what you can do with MyChart, go to [email or web address].    Your next appointment:   12 month(s)  The format for your next appointment:   In Person  Provider:   Candee Furbish, MD        Signed, Candee Furbish, MD  04/17/2020 9:12 AM    [place]

## 2020-04-29 ENCOUNTER — Other Ambulatory Visit: Payer: Self-pay

## 2020-04-29 ENCOUNTER — Ambulatory Visit (HOSPITAL_COMMUNITY): Payer: BLUE CROSS/BLUE SHIELD | Attending: Cardiology

## 2020-04-29 DIAGNOSIS — R011 Cardiac murmur, unspecified: Secondary | ICD-10-CM | POA: Diagnosis not present

## 2020-04-30 ENCOUNTER — Telehealth: Payer: Self-pay | Admitting: Cardiology

## 2020-04-30 NOTE — Telephone Encounter (Signed)
The patient has been notified of the result and verbalized understanding.  All questions (if any) were answered. Antonieta Iba, RN 04/30/2020 2:24 PM

## 2020-04-30 NOTE — Telephone Encounter (Signed)
New message  Patient is returning call for echo results. Please give patient a call back to assist.

## 2020-08-05 DIAGNOSIS — D123 Benign neoplasm of transverse colon: Secondary | ICD-10-CM | POA: Diagnosis not present

## 2020-08-05 DIAGNOSIS — Z86010 Personal history of colonic polyps: Secondary | ICD-10-CM | POA: Diagnosis not present

## 2020-10-13 DIAGNOSIS — Z1159 Encounter for screening for other viral diseases: Secondary | ICD-10-CM | POA: Diagnosis not present

## 2020-10-13 DIAGNOSIS — I10 Essential (primary) hypertension: Secondary | ICD-10-CM | POA: Diagnosis not present

## 2020-10-13 DIAGNOSIS — E782 Mixed hyperlipidemia: Secondary | ICD-10-CM | POA: Diagnosis not present

## 2020-10-13 DIAGNOSIS — Z0000 Encounter for general adult medical examination without abnormal findings: Secondary | ICD-10-CM | POA: Diagnosis not present

## 2020-10-13 DIAGNOSIS — Z23 Encounter for immunization: Secondary | ICD-10-CM | POA: Diagnosis not present

## 2020-10-13 DIAGNOSIS — N529 Male erectile dysfunction, unspecified: Secondary | ICD-10-CM | POA: Diagnosis not present

## 2020-10-13 DIAGNOSIS — I2510 Atherosclerotic heart disease of native coronary artery without angina pectoris: Secondary | ICD-10-CM | POA: Diagnosis not present

## 2020-11-24 DIAGNOSIS — Z20822 Contact with and (suspected) exposure to covid-19: Secondary | ICD-10-CM | POA: Diagnosis not present

## 2021-07-08 ENCOUNTER — Other Ambulatory Visit: Payer: Self-pay | Admitting: *Deleted

## 2021-07-08 MED ORDER — ROSUVASTATIN CALCIUM 5 MG PO TABS: 5.0000 mg | ORAL_TABLET | Freq: Every day | ORAL | 0 refills | Status: DC

## 2021-07-30 ENCOUNTER — Ambulatory Visit (INDEPENDENT_AMBULATORY_CARE_PROVIDER_SITE_OTHER): Payer: BLUE CROSS/BLUE SHIELD | Admitting: Cardiology

## 2021-07-30 ENCOUNTER — Encounter (HOSPITAL_BASED_OUTPATIENT_CLINIC_OR_DEPARTMENT_OTHER): Payer: Self-pay | Admitting: Cardiology

## 2021-07-30 ENCOUNTER — Other Ambulatory Visit: Payer: Self-pay

## 2021-07-30 VITALS — BP 120/70 | HR 72 | Ht 72.0 in | Wt 228.0 lb

## 2021-07-30 DIAGNOSIS — I2510 Atherosclerotic heart disease of native coronary artery without angina pectoris: Secondary | ICD-10-CM

## 2021-07-30 DIAGNOSIS — E782 Mixed hyperlipidemia: Secondary | ICD-10-CM | POA: Diagnosis not present

## 2021-07-30 NOTE — Patient Instructions (Signed)
Medication Instructions:  The current medical regimen is effective;  continue present plan and medications.  *If you need a refill on your cardiac medications before your next appointment, please call your pharmacy*  You have been referred to the [facility] at [address], [address].  You will be contacted to be scheduled.  Follow-Up: At [facility], you and your health needs are our priority.  As part of our continuing mission to provide you with exceptional heart care, we have created designated Provider Care Teams.  These Care Teams include your primary Cardiologist (physician) and Advanced Practice Providers (APPs -  Physician Assistants and Nurse Practitioners) who all work together to provide you with the care you need, when you need it.  We recommend signing up for the patient portal called "MyChart".  Sign up information is provided on this After Visit Summary.  MyChart is used to connect with patients for Virtual Visits (Telemedicine).  Patients are able to view lab/test results, encounter notes, upcoming appointments, etc.  Non-urgent messages can be sent to your provider as well.   To learn more about what you can do with MyChart, go to [email or web address].    Your next appointment:   Follow up with Dr Marlou Porch in 1 year.  Thank you for choosing [facility]!!

## 2021-07-30 NOTE — Progress Notes (Signed)
Cardiology Office Note:    Date:  07/30/2021   ID:  Travis Gomez, DOB 1956/01/17, MRN [id number]  PCP:  Lavone Orn, MD   [facility] Providers Cardiologist:  Candee Furbish, MD     Referring MD: Lavone Orn, MD    History of Present Illness:    Travis Gomez is a 65 y.o. 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  His coronary calcium score was 99 percentile at 3800.  His maternal grandfather had a heart attack at age 33.  Father died at age 48 from an aneurysm.  After taking 1-2 doses of Crestor 10 mg he felt achiness like a viral syndrome.  He stopped it and then went away.  Dense calcifications noted in the LAD and RCA.  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  Feeling little more winded in the heat. No CP  Past Medical History:  Diagnosis Date   Adenomatous polyp of colon    Body mass index (BMI) of 32.0-32.9 in adult    Bug bite    08-14-2018  per pt on vacation and on return 07-29-2018 noted bug bite and became red and very tender, pt urgent care started him antibiotic   ED (erectile dysfunction)    Elevated coronary artery calcium score    Hemochromatosis carrier    Hyperglyceridemia    Hypertension    Hypertension, essential    Obesity, unspecified    Seasonal allergic rhinitis due to pollen     Past Surgical History:  Procedure Laterality Date   [place]   microdiscectomt L 3 to L [place]  99991111   umbilical    NASAL RHINOPHYMA  2017   TRANSANAL HEMORRHOIDAL DEARTERIALIZATION N/A 08/16/2018   Procedure: TRANSANAL HEMORRHOIDAL DEARTERIALIZATION;  Surgeon: Leighton Ruff, MD;  Location: [facility];  Service: General;  Laterality: N/A;    Current Medications: Current Meds  Medication Sig   aspirin EC 81 MG tablet Take 81 mg by  mouth daily.   cholecalciferol (VITAMIN D) 1000 units tablet Take 1,000 Units by mouth daily.   fenofibrate 160 MG tablet Take 160 mg by mouth daily.   lisinopril-hydrochlorothiazide (PRINZIDE,ZESTORETIC) 20-25 MG tablet Take 1 tablet by mouth daily.   Multiple Vitamins-Minerals (MULTIVITAMIN WITH MINERALS) tablet Take 1 tablet by mouth daily.   rosuvastatin (CRESTOR) 5 MG tablet Take 1 tablet (5 mg total) by mouth daily. PLEASE HAVE PT KEEP SCHEDULED APPT FOR FUTURE REFILLS   sildenafil (REVATIO) 20 MG tablet 20 mg. TAKE 2-5 TABLET BY MOUTH AS NEEDED     Allergies:   Patient has no known allergies.   Social History   Socioeconomic History   Marital status: Married    Spouse name: Not on file   Number of children: Not on file   Years of education: Not on file   Highest education level: Not on file  Occupational History   Not on file  Tobacco Use   Smoking status: Former    Packs/day: 3.00    Types: Cigarettes   Smokeless tobacco: Never   Tobacco comments:    quit 1980  Vaping Use   Vaping Use: Never used  Substance and Sexual Activity   Alcohol use: Yes    Comment: occ weekends   Drug use: Never   Sexual activity: Not on file  Other Topics Concern   Not on file  Social History Narrative   Not on file   Social Determinants of Health   Financial Resource Strain: Not on file  Food Insecurity: Not on file  Transportation Needs: Not on file  Physical Activity: Not on file  Stress: Not on file  Social Connections: Not on file     Family History: The patient's family history includes Aneurysm in his father; Healthy in his brother, daughter, and son; Heart attack in his maternal grandfather; Hemochromatosis in his brother, mother, and sister; Liver disease in his mother.  ROS:   Please see the history of present illness.     All other systems reviewed and are negative.  EKGs/Labs/Other Studies Reviewed:    The following studies were reviewed today:  ECHO 2021:    1. Left ventricular ejection fraction, by estimation, is 60 to 65%. The  left ventricle has normal function. The left ventricle has no regional  wall motion abnormalities. There is mild left ventricular hypertrophy.  Left ventricular diastolic parameters  are consistent with Grade I diastolic dysfunction (impaired relaxation).  The average left ventricular global longitudinal strain is -22.3 %. The  global longitudinal strain is normal.   2. Right ventricular systolic function is normal. The right ventricular  size is normal.   3. The mitral valve is normal in structure. Mild mitral valve  regurgitation. No evidence of mitral stenosis.   4. The aortic valve is normal in structure. Aortic valve regurgitation is  not visualized. Mild aortic valve sclerosis is present, with no evidence  of aortic valve stenosis.   5. Aortic dilatation noted. 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   6. The inferior vena cava is normal in size with greater than 50%  respiratory variability, suggesting right atrial pressure of 3 mmHg.  EKG:  EKG is  ordered today.  The ekg ordered today demonstrates NSR 72,   Recent Labs: No results found for requested labs within last 8760 hours.  Recent Lipid Panel No results found for: CHOL, TRIG, HDL, CHOLHDL, VLDL, LDLCALC, LDLDIRECT   Risk Assessment/Calculations:          Physical Exam:    VS:  BP 120/70 (BP Location: Left Arm, Patient Position: Sitting, Cuff Size: Normal)   Pulse 72   Ht 6' (1.829 m)   Wt 228 lb (103.4 kg)   SpO2 95%   BMI 30.92 kg/m     Wt Readings from Last 3 Encounters:  07/30/21 228 lb (103.4 kg)  04/17/20 232 lb (105.2 kg)  01/11/20 235 lb (106.6 kg)     GEN:  Well nourished, well developed in no acute distress HEENT: Normal NECK: No JVD; No carotid bruits LYMPHATICS: No lymphadenopathy CARDIAC: RRR, no murmurs, rubs, gallops RESPIRATORY:  Clear to auscultation without rales,  wheezing or rhonchi  ABDOMEN: Soft, non-tender, non-distended MUSCULOSKELETAL:  No edema; No deformity  SKIN: Warm and dry NEUROLOGIC:  Alert and oriented x 3 PSYCHIATRIC:  Normal affect   ASSESSMENT:    1. Mixed hyperlipidemia   2. Coronary artery calcification seen on CAT scan   3. Atherosclerosis of native coronary artery of native heart without angina pectoris    PLAN:    In order of problems listed above:  Coronary calcium score - 99 percentile 3800.  Stress test Overall reassuring perfusion scan with no ischemia identified.  Normal pumpfunction. Continue with aggressive risk factor modification given his coronarycalcification. -I  will have him touch base with the lipid clinic pharmacy team to discuss potential options to lower his LDL and 50 given his highly elevated coronary calcium score.  Lengthy conversation with he and his wife.  Injectables such as PCSK9 inhibitors may be a good option for him.  1 to be as aggressive as possible to try to minimize his risk over the next 10 years. - Obviously if he begins to have any worsening symptoms of shortness of breath or chest pain, we may directly proceed with coronary angiography.  We discussed this with him.  Wife was present as well. - Continue with aspirin 81 mg  Mixed hyperlipidemia - Continue with Crestor 5 mg once a day.  He does not tolerate any dosage above that. -Could try coenzyme every 10. - With his hypertriglyceridemia he is on fenofibrate.  Baseline at times have been greater than 800.  He is now 400.  I would like for him to discuss with the pharmacy team Vascepa.  He will let me know if any symptoms change. 41 minutes spent with review of patient's material data, discussion with patient and his wife, demonstrating nuclear stress test films as well as CT calcium score.  Candee Furbish, MD         Medication Adjustments/Labs and Tests Ordered: Current medicines are reviewed at length with the patient today.   Concerns regarding medicines are outlined above.  Orders Placed This Encounter  Procedures   AMB Referral to [facility]   EKG 12-Lead   No orders of the defined types were placed in this encounter.   Patient Instructions  Medication Instructions:  The current medical regimen is effective;  continue present plan and medications.  *If you need a refill on your cardiac medications before your next appointment, please call your pharmacy*  You have been referred to the [facility] at [address].  You will be contacted to be scheduled.  Follow-Up: At [facility], you and your health needs are our priority.  As part of our continuing mission to provide you with exceptional heart care, we have created designated Provider Care Teams.  These Care Teams include your primary Cardiologist (physician) and Advanced Practice Providers (APPs -  Physician Assistants and Nurse Practitioners) who all work together to provide you with the care you need, when you need it.  We recommend signing up for the patient portal called "MyChart".  Sign up information is provided on this After Visit Summary.  MyChart is used to connect with patients for Virtual Visits (Telemedicine).  Patients are able to view lab/test results, encounter notes, upcoming appointments, etc.  Non-urgent messages can be sent to your provider as well.   To learn more about what you can do with MyChart, go to [email or web address].    Your next appointment:   Follow up with Dr Marlou Porch in 1 year.  Thank you for choosing [facility]!!     Signed, Candee Furbish, MD  07/30/2021 5:24 PM    [facility]

## 2021-08-17 ENCOUNTER — Ambulatory Visit (INDEPENDENT_AMBULATORY_CARE_PROVIDER_SITE_OTHER): Payer: BLUE CROSS/BLUE SHIELD | Admitting: Pharmacist

## 2021-08-17 ENCOUNTER — Other Ambulatory Visit: Payer: Self-pay

## 2021-08-17 ENCOUNTER — Telehealth: Payer: Self-pay | Admitting: Pharmacist

## 2021-08-17 DIAGNOSIS — I2510 Atherosclerotic heart disease of native coronary artery without angina pectoris: Secondary | ICD-10-CM

## 2021-08-17 DIAGNOSIS — E782 Mixed hyperlipidemia: Secondary | ICD-10-CM | POA: Diagnosis not present

## 2021-08-17 NOTE — Telephone Encounter (Signed)
PA FOR Nathanael Desaulniers JR (KeyJuanetta Beets) - [id number] Vascepa 1GM capsules    Status: PA Request Created: August 22nd, 2022 Sent: August 22nd, 2022 WILL ROUTE BACK TO MELISSA MACCIA SO SHE KNOWS IT HAS BEEN COMPLETED

## 2021-08-17 NOTE — Progress Notes (Signed)
Patient ID: Travis Gomez                 DOB: 06/13/1956                    MRN: [id number]     HPI: Travis Gomez is a 65 y.o. male patient referred to lipid clinic by Dr. Marlou Porch. 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 His coronary calcium score was 99 percentile at 3800.  His maternal grandfather had a heart attack at age 87.  Father died at age 71 from an aneurysm.  After taking 1-2 doses of Crestor 10 mg he felt achiness like a viral syndrome.  He stopped it and then went away.  Dense calcifications noted in the LAD and RCA. April 2021.  Patient presents today to lipid clinic. He is doing ok on rosuvastatin '5mg'$  daily. States just occasional twinge in his shoulders. Thought the Vascepa was going to be instead of the fenofibrate. Admits he not a needle guy. He see's Dr. Laurann Montana 10/19 for his physical. He wants to get Dr. Laurann Montana thoughts on med changes. Will have [employer] plan until the end of the year and then will have to buy plan on [employer].   Current Medications: rosuvastatin '5mg'$  daily, fenofibrate '160mg'$  daily Intolerances: rosuvastatin '10mg'$  (muscle pains) Risk Factors: CAC score, family hx LDL goal: <70  Diet: not discussed this visit  Exercise: works in the yard  Family History: The patient's family history includes Aneurysm in his father; Healthy in his brother, daughter, and son; Heart attack in his maternal grandfather; Hemochromatosis in his brother, mother, and sister; Liver disease in his mother.  Social History: Former smoker  Labs: 10/13/2020 TC 175, HDL 42, LDL 70, TG 401 (rosuvastatin '5mg'$  daily and fenofibrate '160mg'$  daily)  Past Medical History:  Diagnosis Date   Adenomatous polyp of colon    Body mass index (BMI) of 32.0-32.9 in adult    Bug bite    08-14-2018  per pt on vacation and on return 07-29-2018 noted bug bite and became red and very tender, pt  urgent care started him antibiotic   ED (erectile dysfunction)    Elevated coronary artery calcium score    Hemochromatosis carrier    Hyperglyceridemia    Hypertension    Hypertension, essential    Obesity, unspecified    Seasonal allergic rhinitis due to pollen     Current Outpatient Medications on File Prior to Visit  Medication Sig Dispense Refill   aspirin EC 81 MG tablet Take 81 mg by mouth daily.     cholecalciferol (VITAMIN D) 1000 units tablet Take 1,000 Units by mouth daily.     fenofibrate 160 MG tablet Take 160 mg by mouth daily.     lisinopril-hydrochlorothiazide (PRINZIDE,ZESTORETIC) 20-25 MG tablet Take 1 tablet by mouth daily.     Multiple Vitamins-Minerals (MULTIVITAMIN WITH MINERALS) tablet Take 1 tablet by mouth daily.     rosuvastatin (CRESTOR) 5 MG tablet Take 1 tablet (5 mg total) by mouth daily. PLEASE HAVE PT KEEP SCHEDULED APPT FOR FUTURE REFILLS 90 tablet 0   sildenafil (REVATIO) 20 MG tablet 20 mg. TAKE 2-5 TABLET BY MOUTH AS NEEDED     Current Facility-Administered Medications on File Prior to Visit  Medication Dose Route Frequency Provider Last Rate Last Admin   regadenoson (LEXISCAN) injection SOLN 0.4 mg  0.4 mg Intravenous Once Nahser, Wonda Cheng, MD  technetium tetrofosmin (TC-MYOVIEW) injection [id number] millicurie  [id number] millicurie Intravenous Once PRN Nahser, Wonda Cheng, MD       technetium tetrofosmin (TC-MYOVIEW) injection [id number] millicurie  [id number] millicurie Intravenous Once PRN Nahser, Wonda Cheng, MD        No Known Allergies  Assessment/Plan:  1. Hyperlipidemia - LDL was 70 last October on rosuvastatin '5mg'$  daily and fenofibrate '160mg'$  daily. TG high at 401, baseline 800's. We discussed his CAC score, how statins work, risk of high TG, benefits of PCSK9i vs zetia and benefits of Vascepa. Also discussed cost and copay cards of brand name medications. He also talked about how we could give him an LDL goal of <70, however with his age and CAC score, would  prefer to be more aggressive and treat to a goal of <55. However, if his repeat LDL comes back at <70, this will be challenging to get PCSK9i approved. Patient doesn't really want to take a lot of medications or do injections. We did discuss Zetia, but I advised that this doesn't have as good risk reduction data or plaque regression data. Will work on Technical sales engineer approved. Will recheck labs at his PCP in October. Will address LDL and further medication then since patient is hesitant to add more medications at this time. He will talk to Dr. Laurann Montana while I work to get [employer] approved. We did review that Vascepa has to be in addition to fenofibrate and rosuvastatin.   Thank you,   Ramond Dial, Pharm.D, BCPS, CPP [place]  [id number], [place], [place] [address]  Phone: [phone]; Fax: [phone]

## 2021-08-17 NOTE — Telephone Encounter (Signed)
Attempted to do PA for Vascepa on cover my meds. Keeps daying patient not found.  Travis Gomez- can you please call [facility] to do PA for Vascepa 1g capsules- take 2g po BID? Indication is hypertriglyceridemia and CAD.

## 2021-08-18 NOTE — Telephone Encounter (Signed)
The key is not working on covermymeds. Can you follow up on this for me?

## 2021-08-18 NOTE — Telephone Encounter (Signed)
This is still currently under review and the key doesn't work unless you put jr behind the last name.

## 2021-08-19 NOTE — Telephone Encounter (Signed)
Received call from [facility], new key [id number]

## 2021-08-19 NOTE — Telephone Encounter (Signed)
Received another PA form via fax. This has been submitted to insurance.

## 2021-08-19 NOTE — Telephone Encounter (Signed)
Tried that new key but it the message populated that that was a duplicate and the pa is still in process... meaning no determination as of yet

## 2021-08-19 NOTE — Telephone Encounter (Signed)
I used the chat box in cover my meds and this is what they stated: Questions? We're right here! Type to chat with [place].  ME: why do i still not have a dtermination for hey [id number]?  Glean Hess.: Hello! Thank you for chatting with CoverMyMeds. My name is Glean Hess. and I will be with you shortly. We appreciate your patience as we work to help each user.  Glean Hess.: I'd be glad to help. Thank you for sharing the key with me. Just a moment while I pull up this request.  ME: ok  Glean Hess.: I do see where this request was sent to the insurance plan on 08/17/2021 however they have yet to respond with a determination. If you were looking for an updated status it would be best to reach out to the insurance plan directly. The number to Josie Saunders is [phone].

## 2021-08-20 MED ORDER — ICOSAPENT ETHYL 1 G PO CAPS: 2.0000 g | ORAL_CAPSULE | Freq: Two times a day (BID) | ORAL | 3 refills | Status: DC

## 2021-08-20 NOTE — Addendum Note (Signed)
Addended by: Marcelle Overlie D on: 08/20/2021 03:18 PM   Modules accepted: Orders

## 2021-08-20 NOTE — Telephone Encounter (Signed)
Vascepa approved through 08/20/22. Copay card activated  BIN#  [id number] PCN#  CN GRP#  [id number] ID#  [id number]  Called copay card into the pharmacy- they report $0 copay Called patient and made him aware Will repeat lipids in oct with PCP

## 2021-10-08 ENCOUNTER — Other Ambulatory Visit: Payer: Self-pay

## 2021-10-08 MED ORDER — ROSUVASTATIN CALCIUM 5 MG PO TABS: 5.0000 mg | ORAL_TABLET | Freq: Every day | ORAL | 3 refills | Status: DC

## 2021-12-10 MED ORDER — REPATHA SURECLICK 140 MG/ML ~~LOC~~ SOAJ: 1.0000 "pen " | SUBCUTANEOUS | 3 refills | Status: DC

## 2021-12-14 ENCOUNTER — Encounter: Payer: Self-pay | Admitting: Pharmacist

## 2021-12-16 NOTE — Telephone Encounter (Signed)
Pt called clinic. Discussed rationale behind all of his lipid lowering medications. He is agreeable to starting Repatha. He's heading to [place] for a few months and will call us when he gets back to schedule labs.

## 2022-03-08 ENCOUNTER — Telehealth: Payer: Self-pay

## 2022-03-08 DIAGNOSIS — E782 Mixed hyperlipidemia: Secondary | ICD-10-CM

## 2022-03-08 MED ORDER — REPATHA SURECLICK 140 MG/ML ~~LOC~~ SOAJ: 1.0000 "pen " | SUBCUTANEOUS | 3 refills | Status: DC

## 2022-03-08 MED ORDER — ICOSAPENT ETHYL 1 G PO CAPS: 2.0000 g | ORAL_CAPSULE | Freq: Two times a day (BID) | ORAL | 3 refills | Status: DC

## 2022-03-08 NOTE — Telephone Encounter (Signed)
Patient left message.  [facility]  Please call [phone].  Thank you ?

## 2022-03-08 NOTE — Telephone Encounter (Signed)
Called and spoke w/pt and they stated that they were in [place] and can get the lab work done there. They were currently driving and could call me back later on and provide me the address to the pharmacy that they will be using in [place] because he needed refills of vascepa and repatha.  ?

## 2022-03-08 NOTE — Telephone Encounter (Signed)
-----   Message from Ramond Dial, [place] sent at 03/08/2022  8:29 AM EDT ----- ? ?----- Message ----- ?From: Ramond Dial, RPH-CPP ?Sent: 03/05/2022  12:00 AM EDT ?To: Ramond Dial, RPH-CPP ? ? ?----- Message ----- ?From: Ramond Dial, RPH-CPP ?Sent: 12/10/2021  10:04 AM EST ?To: Ramond Dial, RPH-CPP ? ?Set up lipids ? ? ? ?

## 2022-03-08 NOTE — Telephone Encounter (Signed)
Pt called in and provided the [facility] [place] so I sent refills for repatha and vascepa as requested. Pt also stated that he needed a new pa since he changed insurances. I am awaiting the questions to populate on cmm. ? ?JEREMYAH JELLEY (Key: [id number]) ?Repatha SureClick '140MG'$ /ML auto-injectors ?Status: Question Request ?Created: March 13th, 2023 ?

## 2022-03-08 NOTE — Addendum Note (Signed)
Addended by: Allean Found on: 03/08/2022 03:45 PM ? ? Modules accepted: Orders ? ?

## 2022-03-09 DIAGNOSIS — E782 Mixed hyperlipidemia: Secondary | ICD-10-CM | POA: Diagnosis not present

## 2022-03-10 LAB — LIPID PANEL
Chol/HDL Ratio: 1.8 ratio (ref 0.0–5.0)
Cholesterol, Total: 93 mg/dL — ABNORMAL LOW (ref 100–199)
HDL: 53 mg/dL (ref >39)
LDL Chol Calc (NIH): 21 mg/dL (ref 0–99)
Triglycerides: 98 mg/dL (ref 0–149)
VLDL Cholesterol Cal: 19 mg/dL (ref 5–40)

## 2022-03-15 ENCOUNTER — Other Ambulatory Visit: Payer: Self-pay

## 2022-03-15 MED ORDER — REPATHA SURECLICK 140 MG/ML ~~LOC~~ SOAJ: 1.0000 "pen " | SUBCUTANEOUS | 3 refills | Status: DC

## 2022-03-17 ENCOUNTER — Telehealth: Payer: Self-pay

## 2022-03-17 NOTE — Telephone Encounter (Signed)
Ok to change to [employer] '75mg'$  sq q 14 days ?

## 2022-03-17 NOTE — Telephone Encounter (Signed)
Pa submitted for praluent 75 q14 days ?Eeshan Wiedel (Key: [id number]) - [id number] ?Praluent '75MG'$ /ML auto-injectors ?Status: PA Request ?Created: March 22nd, 2023 ?Sent: March 22nd, 2023 ?

## 2022-03-17 NOTE — Telephone Encounter (Signed)
Pt called and stated that their pharmacy mentioned the repatha is not covered and praluent is formulary. I will send to Marcelle Overlie to see if praluent '75mg'$  q2w is appropriate or the '150mg'$  and if changing is ok ?

## 2022-03-19 ENCOUNTER — Encounter: Payer: Self-pay | Admitting: Pharmacist

## 2022-03-19 MED ORDER — PRALUENT 75 MG/ML ~~LOC~~ SOAJ: 1.0000 "pen " | SUBCUTANEOUS | 11 refills | Status: DC

## 2022-03-19 NOTE — Addendum Note (Signed)
Addended by: Marcelle Overlie D on: 03/19/2022 03:17 PM ? ? Modules accepted: Orders ? ?

## 2022-03-19 NOTE — Telephone Encounter (Signed)
Praluent approved through 03/19/23. My chart message sent to patient. Rx sent to pharamcy ?

## 2022-03-26 ENCOUNTER — Telehealth: Payer: Self-pay

## 2022-03-26 NOTE — Telephone Encounter (Signed)
**Note De-Identified Travis Gomez Obfuscation** Vascepa PA stated through [employer]. ?Key: [id number] ?

## 2022-04-01 NOTE — Telephone Encounter (Signed)
**Note De-Identified Karcyn Menn Obfuscation** Per letter from [place]: ?The information provided indicates that ?the requested medication is a brand name medication AND the generic form of the medication is on the ?formulary. The request was denied because, based upon the information that is available to [place], you have ?not had an adverse reaction to an inactive ingredient that is in the generic formulation. ?Formulary alternative(s) are icosapent ethyl; omega-3-acid ethyl esters capsule. Requirement: 3 in a class ?with 3 or more alternatives, 2 in a class with 2 alternatives, or 1 in a class with only 1 alternative. Please ?refer to your plan documents for a complete list of alternatives. ? ?[facility]ent. ? ?I have notified [facility] that Icosapent is the preferred medication on the plan's list of covered medications and that Vascepa was denied. ?I did advise them to call Jeani Hawking at [phone] if they have any questions. ?

## 2022-06-03 DIAGNOSIS — I10 Essential (primary) hypertension: Secondary | ICD-10-CM | POA: Diagnosis not present

## 2022-06-03 DIAGNOSIS — E782 Mixed hyperlipidemia: Secondary | ICD-10-CM | POA: Diagnosis not present

## 2022-06-03 DIAGNOSIS — I2510 Atherosclerotic heart disease of native coronary artery without angina pectoris: Secondary | ICD-10-CM | POA: Diagnosis not present

## 2022-08-23 ENCOUNTER — Encounter: Payer: Self-pay | Admitting: Cardiology

## 2022-10-13 ENCOUNTER — Other Ambulatory Visit: Payer: Self-pay

## 2022-10-13 MED ORDER — ROSUVASTATIN CALCIUM 5 MG PO TABS: 5.0000 mg | ORAL_TABLET | Freq: Every day | ORAL | 0 refills | Status: DC

## 2022-10-26 ENCOUNTER — Encounter: Payer: Self-pay | Admitting: Cardiology

## 2022-11-01 DIAGNOSIS — E782 Mixed hyperlipidemia: Secondary | ICD-10-CM | POA: Diagnosis not present

## 2022-11-01 DIAGNOSIS — I10 Essential (primary) hypertension: Secondary | ICD-10-CM | POA: Diagnosis not present

## 2022-11-01 DIAGNOSIS — Z79899 Other long term (current) drug therapy: Secondary | ICD-10-CM | POA: Diagnosis not present

## 2022-11-03 ENCOUNTER — Ambulatory Visit: Payer: BLUE CROSS/BLUE SHIELD | Admitting: Cardiology

## 2022-12-06 ENCOUNTER — Ambulatory Visit: Payer: BLUE CROSS/BLUE SHIELD | Admitting: Cardiology

## 2022-12-14 DIAGNOSIS — N289 Disorder of kidney and ureter, unspecified: Secondary | ICD-10-CM | POA: Diagnosis not present

## 2023-01-03 ENCOUNTER — Ambulatory Visit: Payer: 59 | Attending: Cardiology | Admitting: Cardiology

## 2023-01-03 ENCOUNTER — Encounter: Payer: Self-pay | Admitting: Cardiology

## 2023-01-03 ENCOUNTER — Other Ambulatory Visit: Payer: Self-pay | Admitting: *Deleted

## 2023-01-03 VITALS — BP 130/70 | HR 83 | Ht 72.0 in | Wt 226.0 lb

## 2023-01-03 DIAGNOSIS — I2510 Atherosclerotic heart disease of native coronary artery without angina pectoris: Secondary | ICD-10-CM | POA: Diagnosis not present

## 2023-01-03 DIAGNOSIS — Z79899 Other long term (current) drug therapy: Secondary | ICD-10-CM

## 2023-01-03 DIAGNOSIS — E782 Mixed hyperlipidemia: Secondary | ICD-10-CM

## 2023-01-03 NOTE — Progress Notes (Signed)
Cardiology Office Note:    Date:  01/03/2023   ID:  Travis Gomez, DOB 07-31-1956, MRN [id number]  PCP:  Charlane Ferretti, MD   [facility] Providers Cardiologist:  Candee Furbish, MD     Referring MD: Lavone Orn, MD    History of Present Illness:    Travis Gomez is a 67 y.o. 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  His coronary calcium score was 99 percentile at 3800. Marland Kitchen  Dense calcifications noted in the LAD and RCA. His maternal grandfather had a heart attack at age 27.  Father died at age 38 from an aneurysm.  After taking 1-2 doses of Crestor 10 mg he felt achiness like a viral syndrome.  He stopped it and then went away.  Has visited lipid clinic.  Is on Repatha injection now.  Also on Vascepa and fenofibrate.  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  Lives part-time in [place].  This is between [employer] and [place].  Feeling little more winded in the heat at times. No CP, no significant other symptoms.  Overall no significant change.  See below for further discussion.  Past Medical History:  Diagnosis Date   Adenomatous polyp of colon    Body mass index (BMI) of 32.0-32.9 in adult    Bug bite    08-14-2018  per pt on vacation and on return 07-29-2018 noted bug bite and became red and very tender, pt urgent care started him antibiotic   ED (erectile dysfunction)    Elevated coronary artery calcium score    Hemochromatosis carrier    Hyperglyceridemia    Hypertension    Hypertension, essential    Obesity, unspecified    Seasonal allergic rhinitis due to pollen     Past Surgical History:  Procedure Laterality Date   [place]   microdiscectomt L 3 to L [place]  [id number]   umbilical    NASAL RHINOPHYMA  2017   TRANSANAL HEMORRHOIDAL  DEARTERIALIZATION N/A 08/16/2018   Procedure: TRANSANAL HEMORRHOIDAL DEARTERIALIZATION;  Surgeon: Leighton Ruff, MD;  Location: [place];  Service: General;  Laterality: N/A;    Current Medications: Current Meds  Medication Sig   aspirin EC 81 MG tablet Take 81 mg by mouth daily.   cholecalciferol (VITAMIN D) 1000 units tablet Take 1,000 Units by mouth daily.   Evolocumab (REPATHA [place]) Inject 75 mg into the skin every 14 (fourteen) days.   icosapent Ethyl (VASCEPA) 1 g capsule Take 2 capsules (2 g total) by mouth 2 (two) times daily.   lisinopril-hydrochlorothiazide (PRINZIDE,ZESTORETIC) 20-25 MG tablet Take 1 tablet by mouth daily.   Multiple Vitamins-Minerals (MULTIVITAMIN WITH MINERALS) tablet Take 1 tablet by mouth daily.   rosuvastatin (CRESTOR) 5 MG tablet Take 1 tablet (5 mg total) by mouth daily.   sildenafil (REVATIO) 20 MG tablet 20 mg. TAKE 2-5 TABLET BY MOUTH AS NEEDED   [DISCONTINUED] fenofibrate 160 MG tablet Take 160 mg by mouth daily.     Allergies:   Patient has no known allergies.   Social History   Socioeconomic History   Marital status: Married    Spouse name: Not on file   Number of children: Not on file   Years of education: Not on file   Highest education level: Not on file  Occupational  History   Not on file  Tobacco Use   Smoking status: Former    Packs/day: 3.00    Types: Cigarettes   Smokeless tobacco: Never   Tobacco comments:    quit 1980  Vaping Use   Vaping Use: Never used  Substance and Sexual Activity   Alcohol use: Yes    Comment: occ weekends   Drug use: Never   Sexual activity: Not on file  Other Topics Concern   Not on file  Social History Narrative   Not on file   Social Determinants of Health   Financial Resource Strain: Not on file  Food Insecurity: Not on file  Transportation Needs: Not on file  Physical Activity: Not on file  Stress: Not on file  Social Connections: Not on file     Family  History: The patient's family history includes Aneurysm in his father; Healthy in his brother, daughter, and son; Heart attack in his maternal grandfather; Hemochromatosis in his brother, mother, and sister; Liver disease in his mother.  ROS:   Please see the history of present illness.     All other systems reviewed and are negative.  EKGs/Labs/Other Studies Reviewed:    The following studies were reviewed today:  ECHO 2021:   1. Left ventricular ejection fraction, by estimation, is 60 to 65%. The  left ventricle has normal function. The left ventricle has no regional  wall motion abnormalities. There is mild left ventricular hypertrophy.  Left ventricular diastolic parameters  are consistent with Grade I diastolic dysfunction (impaired relaxation).  The average left ventricular global longitudinal strain is -22.3 %. The  global longitudinal strain is normal.   2. Right ventricular systolic function is normal. The right ventricular  size is normal.   3. The mitral valve is normal in structure. Mild mitral valve  regurgitation. No evidence of mitral stenosis.   4. The aortic valve is normal in structure. Aortic valve regurgitation is  not visualized. Mild aortic valve sclerosis is present, with no evidence  of aortic valve stenosis.   5. Aortic dilatation noted. 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   6. The inferior vena cava is normal in size with greater than 50%  respiratory variability, suggesting right atrial pressure of 3 mmHg.  EKG:  EKG is  ordered today.   NSR 83 inf Q Prior NSR 72,   Recent Labs: No results found for requested labs within last 365 days.  Recent Lipid Panel    Component Value Date/Time   CHOL 93 (L) 03/09/2022 0812   TRIG 98 03/09/2022 0812   HDL 53 03/09/2022 0812   CHOLHDL 1.8 03/09/2022 0812   LDLCALC 21 03/09/2022 0812     Risk Assessment/Calculations:          Physical Exam:    VS:  BP  130/70 (BP Location: Left Arm, Patient Position: Sitting, Cuff Size: Normal)   Pulse 83   Ht 6' (1.829 m)   Wt 226 lb (102.5 kg)   BMI 30.65 kg/m     Wt Readings from Last 3 Encounters:  01/03/23 226 lb (102.5 kg)  07/30/21 228 lb (103.4 kg)  04/17/20 232 lb (105.2 kg)     GEN: Well nourished, well developed, in no acute distress HEENT: normal Neck: no JVD, carotid bruits, or masses Cardiac: RRR; no murmurs, rubs, or gallops,no edema  Respiratory:  clear to auscultation bilaterally, normal work of breathing GI: soft, nontender, nondistended, +  BS MS: no deformity or atrophy Skin: warm and dry, no rash Neuro:  Alert and Oriented x 3, Strength and sensation are intact Psych: euthymic mood, full affect   ASSESSMENT:    1. Mixed hyperlipidemia   2. Atherosclerosis of native coronary artery of native heart without angina pectoris   3. Medication management     PLAN:    In order of problems listed above:  Coronary calcium score - 99th percentile 3800.  Stress test Overall reassuring perfusion scan with no ischemia identified.  Normal pump function. Continue with aggressive risk factor modification given his coronary calcification. - Obviously if he begins to have any worsening symptoms of shortness of breath or chest pain, we may directly proceed with coronary angiography.  We discussed this with him.  Wife was present as well. Decrease beer - Continue with aspirin 81 mg --Brother is having CABG  Mixed hyperlipidemia - Switching to Repatha from Praluent PCSK9 inhibitor.  Driven by [employer].  Vascepa.  Sometimes misses his evening dose.  He was told that he could take all 4 pills in the morning.  They were interested in coming off of fenofibrate.- Baseline at times have been greater than 800.  He was 98, presumably fasting.  However it was 498 on a prior check with his PCP.  I am fine with him coming off of the fenofibrate.  He needs to decrease beer intake.   Mediterranean diet.  I would like for him to lose 25 to 30 pounds.  Decrease carbohydrates.  He will let me know if any symptoms change.   Candee Furbish, MD         Medication Adjustments/Labs and Tests Ordered: Current medicines are reviewed at length with the patient today.  Concerns regarding medicines are outlined above.  Orders Placed This Encounter  Procedures   Lipid panel   EKG 12-Lead   No orders of the defined types were placed in this encounter.    Patient Instructions  Medication Instructions:  Please discontinue your Fenofibrate. Continue all other medications as listed.  *If you need a refill on your cardiac medications before your next appointment, please call your pharmacy*  Lab Work: Please have blood work (Lipid) in 3 months at your closest [place].  If you have labs (blood work) drawn today and your tests are completely normal, you will receive your results only by: [place] (if you have [place]) OR A paper copy in the mail If you have any lab test that is abnormal or we need to change your treatment, we will call you to review the results.  Follow-Up: At [facility], you and your health needs are our priority.  As part of our continuing mission to provide you with exceptional heart care, we have created designated Provider Care Teams.  These Care Teams include your primary Cardiologist (physician) and Advanced Practice Providers (APPs -  Physician Assistants and Nurse Practitioners) who all work together to provide you with the care you need, when you need it.  We recommend signing up for the patient portal called "MyChart".  Sign up information is provided on this After Visit Summary.  MyChart is used to connect with patients for Virtual Visits (Telemedicine).  Patients are able to view lab/test results, encounter notes, upcoming appointments, etc.  Non-urgent messages can be sent to your provider as well.   To learn more about what you  can do with MyChart, go to [email or web address].    Your next appointment:  1 year(s)  The format for your next appointment:   In Person  Provider:   Candee Furbish, MD     [place] refers to food and lifestyle choices that are based on the traditions of countries located on the [place]. It focuses on eating more fruits, vegetables, whole grains, beans, nuts, seeds, and heart-healthy fats, and eating less dairy, meat, eggs, and processed foods with added sugar, salt, and fat. This way of eating has been shown to help prevent certain conditions and improve outcomes for people who have chronic diseases, like kidney disease and heart disease. What are tips for following this plan? Reading food labels Check the serving size of packaged foods. For foods such as rice and pasta, the serving size refers to the amount of cooked product, not dry. Check the total fat in packaged foods. Avoid foods that have saturated fat or trans fats. Check the ingredient list for added sugars, such as corn syrup. Shopping  Buy a variety of foods that offer a balanced diet, including: Fresh fruits and vegetables (produce). Grains, beans, nuts, and seeds. Some of these may be available in unpackaged forms or large amounts (in bulk). Fresh seafood. Poultry and eggs. Low-fat dairy products. Buy whole ingredients instead of prepackaged foods. Buy fresh fruits and vegetables in-season from local farmers markets. Buy plain frozen fruits and vegetables. If you do not have access to quality fresh seafood, buy precooked frozen shrimp or canned fish, such as tuna, salmon, or sardines. Stock your pantry so you always have certain foods on hand, such as olive oil, canned tuna, canned tomatoes, rice, pasta, and beans. Cooking Cook foods with extra-virgin olive oil instead of using butter or other vegetable oils. Have meat as a side dish, and have vegetables or grains as your main  dish. This means having meat in small portions or adding small amounts of meat to foods like pasta or stew. Use beans or vegetables instead of meat in common dishes like chili or lasagna. Experiment with different cooking methods. Try roasting, broiling, steaming, and sauting vegetables. Add frozen vegetables to soups, stews, pasta, or rice. Add nuts or seeds for added healthy fats and plant protein at each meal. You can add these to yogurt, salads, or vegetable dishes. Marinate fish or vegetables using olive oil, lemon juice, garlic, and fresh herbs. Meal planning Plan to eat one vegetarian meal one day each week. Try to work up to two vegetarian meals, if possible. Eat seafood two or more times a week. Have healthy snacks readily available, such as: Vegetable sticks with hummus. Greek yogurt. Fruit and nut trail mix. Eat balanced meals throughout the week. This includes: Fruit: 2-3 servings a day. Vegetables: 4-5 servings a day. Low-fat dairy: 2 servings a day. Fish, poultry, or lean meat: 1 serving a day. Beans and legumes: 2 or more servings a week. Nuts and seeds: 1-2 servings a day. Whole grains: 6-8 servings a day. Extra-virgin olive oil: 3-4 servings a day. Limit red meat and sweets to only a few servings a month. Lifestyle  Cook and eat meals together with your family, when possible. Drink enough fluid to keep your urine pale yellow. Be physically active every day. This includes: Aerobic exercise like running or swimming. Leisure activities like gardening, walking, or housework. Get 7-8 hours of sleep each night. If recommended by your health care provider, drink red wine in moderation. This means 1 glass a day for nonpregnant women and 2 glasses a day  for men. A glass of wine equals 5 oz (150 mL). What foods should I eat? Fruits Apples. Apricots. Avocado. Berries. Bananas. Cherries. Dates. Figs. Grapes. Lemons. Melon. Oranges. Peaches. Plums.  Pomegranate. Vegetables Artichokes. Beets. Broccoli. Cabbage. Carrots. Eggplant. Green beans. Chard. Kale. Spinach. Onions. Leeks. Peas. Squash. Tomatoes. Peppers. Radishes. Grains Whole-grain pasta. Brown rice. Bulgur wheat. Polenta. Couscous. Whole-wheat bread. [place]. Meats and other proteins Beans. Almonds. Sunflower seeds. Pine nuts. Peanuts. [place]. Salmon. Scallops. Shrimp. [place]. Tilapia. Clams. Oysters. Eggs. Poultry without skin. Dairy Low-fat milk. Cheese. Greek yogurt. Fats and oils Extra-virgin olive oil. Avocado oil. Grapeseed oil. Beverages Water. Red wine. Herbal tea. Sweets and desserts Greek yogurt with honey. Baked apples. Poached pears. Trail mix. Seasonings and condiments Basil. Cilantro. Coriander. Cumin. Mint. Parsley. Sage. Rosemary. Tarragon. Garlic. Oregano. Thyme. Pepper. Balsamic vinegar. Tahini. Hummus. Tomato sauce. Olives. Mushrooms. The items listed above may not be a complete list of foods and beverages you can eat. Contact a dietitian for more information. What foods should I limit? This is a list of foods that should be eaten rarely or only on special occasions. Fruits Fruit canned in syrup. Vegetables Deep-fried potatoes (french fries). Grains Prepackaged pasta or rice dishes. Prepackaged cereal with added sugar. Prepackaged snacks with added sugar. Meats and other proteins Beef. Pork. Lamb. Poultry with skin. Hot dogs. Berniece Salines. Dairy Ice cream. Sour cream. Whole milk. Fats and oils Butter. Canola oil. Vegetable oil. Beef fat (tallow). Lard. Beverages Juice. Sugar-sweetened soft drinks. Beer. Liquor and spirits. Sweets and desserts Cookies. Cakes. Pies. Candy. Seasonings and condiments Mayonnaise. Pre-made sauces and marinades. The items listed above may not be a complete list of foods and beverages you should limit. Contact a dietitian for more information. Summary The Mediterranean diet includes both food and lifestyle choices. Eat a  variety of fresh fruits and vegetables, beans, nuts, seeds, and whole grains. Limit the amount of red meat and sweets that you eat. If recommended by your health care provider, drink red wine in moderation. This means 1 glass a day for nonpregnant women and 2 glasses a day for men. A glass of wine equals 5 oz (150 mL). This information is not intended to replace advice given to you by your health care provider. Make sure you discuss any questions you have with your health care provider. Document Revised: 01/18/2020 Document Reviewed: 11/15/2019 Elsevier Patient Education  [place]         Signed, Candee Furbish, MD  01/03/2023 10:26 AM    [place]

## 2023-01-03 NOTE — Patient Instructions (Signed)
Medication Instructions:  Please discontinue your Fenofibrate. Continue all other medications as listed.  *If you need a refill on your cardiac medications before your next appointment, please call your pharmacy*  Lab Work: Please have blood work (Lipid) in 3 months at your closest [facility].  If you have labs (blood work) drawn today and your tests are completely normal, you will receive your results only by: [place] (if you have [place]) OR A paper copy in the mail If you have any lab test that is abnormal or we need to change your treatment, we will call you to review the results.  Follow-Up: At [facility], you and your health needs are our priority.  As part of our continuing mission to provide you with exceptional heart care, we have created designated Provider Care Teams.  These Care Teams include your primary Cardiologist (physician) and Advanced Practice Providers (APPs -  Physician Assistants and Nurse Practitioners) who all work together to provide you with the care you need, when you need it.  We recommend signing up for the patient portal called "MyChart".  Sign up information is provided on this After Visit Summary.  MyChart is used to connect with patients for Virtual Visits (Telemedicine).  Patients are able to view lab/test results, encounter notes, upcoming appointments, etc.  Non-urgent messages can be sent to your provider as well.   To learn more about what you can do with MyChart, go to [email or web address].    Your next appointment:   1 year(s)  The format for your next appointment:   In Person  Provider:   Candee Furbish, MD     [place] refers to food and lifestyle choices that are based on the traditions of countries located on the [place]. It focuses on eating more fruits, vegetables, whole grains, beans, nuts, seeds, and heart-healthy fats, and eating less dairy, meat, eggs, and processed foods with  added sugar, salt, and fat. This way of eating has been shown to help prevent certain conditions and improve outcomes for people who have chronic diseases, like kidney disease and heart disease. What are tips for following this plan? Reading food labels Check the serving size of packaged foods. For foods such as rice and pasta, the serving size refers to the amount of cooked product, not dry. Check the total fat in packaged foods. Avoid foods that have saturated fat or trans fats. Check the ingredient list for added sugars, such as corn syrup. Shopping  Buy a variety of foods that offer a balanced diet, including: Fresh fruits and vegetables (produce). Grains, beans, nuts, and seeds. Some of these may be available in unpackaged forms or large amounts (in bulk). Fresh seafood. Poultry and eggs. Low-fat dairy products. Buy whole ingredients instead of prepackaged foods. Buy fresh fruits and vegetables in-season from local farmers markets. Buy plain frozen fruits and vegetables. If you do not have access to quality fresh seafood, buy precooked frozen shrimp or canned fish, such as tuna, salmon, or sardines. Stock your pantry so you always have certain foods on hand, such as olive oil, canned tuna, canned tomatoes, rice, pasta, and beans. Cooking Cook foods with extra-virgin olive oil instead of using butter or other vegetable oils. Have meat as a side dish, and have vegetables or grains as your main dish. This means having meat in small portions or adding small amounts of meat to foods like pasta or stew. Use beans or vegetables instead of meat in common dishes  like chili or lasagna. Experiment with different cooking methods. Try roasting, broiling, steaming, and sauting vegetables. Add frozen vegetables to soups, stews, pasta, or rice. Add nuts or seeds for added healthy fats and plant protein at each meal. You can add these to yogurt, salads, or vegetable dishes. Marinate fish or vegetables  using olive oil, lemon juice, garlic, and fresh herbs. Meal planning Plan to eat one vegetarian meal one day each week. Try to work up to two vegetarian meals, if possible. Eat seafood two or more times a week. Have healthy snacks readily available, such as: Vegetable sticks with hummus. Greek yogurt. Fruit and nut trail mix. Eat balanced meals throughout the week. This includes: Fruit: 2-3 servings a day. Vegetables: 4-5 servings a day. Low-fat dairy: 2 servings a day. Fish, poultry, or lean meat: 1 serving a day. Beans and legumes: 2 or more servings a week. Nuts and seeds: 1-2 servings a day. Whole grains: 6-8 servings a day. Extra-virgin olive oil: 3-4 servings a day. Limit red meat and sweets to only a few servings a month. Lifestyle  Cook and eat meals together with your family, when possible. Drink enough fluid to keep your urine pale yellow. Be physically active every day. This includes: Aerobic exercise like running or swimming. Leisure activities like gardening, walking, or housework. Get 7-8 hours of sleep each night. If recommended by your health care provider, drink red wine in moderation. This means 1 glass a day for nonpregnant women and 2 glasses a day for men. A glass of wine equals 5 oz (150 mL). What foods should I eat? Fruits Apples. Apricots. Avocado. Berries. Bananas. Cherries. Dates. Figs. Grapes. Lemons. Melon. Oranges. Peaches. Plums. Pomegranate. Vegetables Artichokes. Beets. Broccoli. Cabbage. Carrots. Eggplant. Green beans. Chard. Kale. Spinach. Onions. Leeks. Peas. Squash. Tomatoes. Peppers. Radishes. Grains Whole-grain pasta. Brown rice. Bulgur wheat. Polenta. Couscous. Whole-wheat bread. [place]. Meats and other proteins Beans. Almonds. Sunflower seeds. Pine nuts. Peanuts. [place]. Salmon. Scallops. Shrimp. [place]. Tilapia. Clams. Oysters. Eggs. Poultry without skin. Dairy Low-fat milk. Cheese. Greek yogurt. Fats and oils Extra-virgin olive oil.  Avocado oil. Grapeseed oil. Beverages Water. Red wine. Herbal tea. Sweets and desserts Greek yogurt with honey. Baked apples. Poached pears. Trail mix. Seasonings and condiments Basil. Cilantro. Coriander. Cumin. Mint. Parsley. Sage. Rosemary. Tarragon. Garlic. Oregano. Thyme. Pepper. Balsamic vinegar. Tahini. Hummus. Tomato sauce. Olives. Mushrooms. The items listed above may not be a complete list of foods and beverages you can eat. Contact a dietitian for more information. What foods should I limit? This is a list of foods that should be eaten rarely or only on special occasions. Fruits Fruit canned in syrup. Vegetables Deep-fried potatoes (french fries). Grains Prepackaged pasta or rice dishes. Prepackaged cereal with added sugar. Prepackaged snacks with added sugar. Meats and other proteins Beef. Pork. Lamb. Poultry with skin. Hot dogs. Berniece Salines. Dairy Ice cream. Sour cream. Whole milk. Fats and oils Butter. Canola oil. Vegetable oil. Beef fat (tallow). Lard. Beverages Juice. Sugar-sweetened soft drinks. Beer. Liquor and spirits. Sweets and desserts Cookies. Cakes. Pies. Candy. Seasonings and condiments Mayonnaise. Pre-made sauces and marinades. The items listed above may not be a complete list of foods and beverages you should limit. Contact a dietitian for more information. Summary The Mediterranean diet includes both food and lifestyle choices. Eat a variety of fresh fruits and vegetables, beans, nuts, seeds, and whole grains. Limit the amount of red meat and sweets that you eat. If recommended by your health care provider, drink red wine  in moderation. This means 1 glass a day for nonpregnant women and 2 glasses a day for men. A glass of wine equals 5 oz (150 mL). This information is not intended to replace advice given to you by your health care provider. Make sure you discuss any questions you have with your health care provider. Document Revised: 01/18/2020 Document  Reviewed: 11/15/2019 Elsevier Patient Education  [place]

## 2023-01-06 ENCOUNTER — Other Ambulatory Visit: Payer: Self-pay | Admitting: Pharmacist

## 2023-01-06 NOTE — Telephone Encounter (Signed)
Patient's plan now prefers Repatha again. Will submit PA and then send Rx to [facility] in [facility] per pt request Key: [facility]

## 2023-01-07 ENCOUNTER — Telehealth: Payer: Self-pay | Admitting: Pharmacist

## 2023-01-07 NOTE — Telephone Encounter (Signed)
Repatha PA in progress. Not sure why med was removed from his list, he was advised to continue taking in Jan 2024 cards visit. This has been re-added. Received fax that lipid panel within last few months is needed. He had his cholesterol checked more recently at his PCP office at [facility] - LDL cholesterol was 11 on 11/01/22. Will fax this to pt's insurance for continuation of therapy for Repatha request.

## 2023-01-07 NOTE — Telephone Encounter (Signed)
Request still pending.

## 2023-01-10 ENCOUNTER — Other Ambulatory Visit: Payer: Self-pay | Admitting: Pharmacist

## 2023-01-10 ENCOUNTER — Encounter: Payer: Self-pay | Admitting: Pharmacist

## 2023-01-10 MED ORDER — REPATHA SURECLICK 140 MG/ML ~~LOC~~ SOAJ: 1.0000 mL | SUBCUTANEOUS | 11 refills | Status: DC

## 2023-01-10 MED ORDER — REPATHA SURECLICK 140 MG/ML ~~LOC~~ SOAJ: 1.0000 | SUBCUTANEOUS | 11 refills | Status: DC

## 2023-01-10 NOTE — Telephone Encounter (Signed)
Repatha PA approved through 01/09/24, refill sent to pharmacy.

## 2023-01-10 NOTE — Addendum Note (Signed)
Addended by: Fionna Merriott E on: 01/10/2023 08:35 AM   Modules accepted: Orders

## 2023-01-11 ENCOUNTER — Other Ambulatory Visit: Payer: Self-pay

## 2023-01-11 MED ORDER — ROSUVASTATIN CALCIUM 5 MG PO TABS: 5.0000 mg | ORAL_TABLET | Freq: Every day | ORAL | 3 refills | Status: DC

## 2023-03-29 ENCOUNTER — Other Ambulatory Visit: Payer: Self-pay | Admitting: Pharmacist

## 2023-03-29 MED ORDER — ICOSAPENT ETHYL 1 G PO CAPS: 2.0000 g | ORAL_CAPSULE | Freq: Two times a day (BID) | ORAL | 3 refills | Status: DC

## 2023-04-20 ENCOUNTER — Ambulatory Visit: Payer: 59 | Attending: Cardiology

## 2023-04-20 DIAGNOSIS — Z79899 Other long term (current) drug therapy: Secondary | ICD-10-CM | POA: Diagnosis not present

## 2023-04-20 DIAGNOSIS — E782 Mixed hyperlipidemia: Secondary | ICD-10-CM | POA: Diagnosis not present

## 2023-04-20 LAB — LIPID PANEL
Chol/HDL Ratio: 1.8 ratio (ref 0.0–5.0)
Cholesterol, Total: 100 mg/dL (ref 100–199)
HDL: 57 mg/dL (ref >39)
LDL Chol Calc (NIH): 19 mg/dL (ref 0–99)
Triglycerides: 141 mg/dL (ref 0–149)
VLDL Cholesterol Cal: 24 mg/dL (ref 5–40)

## 2023-04-20 NOTE — Addendum Note (Signed)
Addended by: Alvin Critchley A on: 04/20/2023 08:06 AM   Modules accepted: Orders

## 2023-04-20 NOTE — Addendum Note (Signed)
Addended by: Alvin Critchley A on: 04/20/2023 07:55 AM   Modules accepted: Orders

## 2023-05-02 DIAGNOSIS — I2510 Atherosclerotic heart disease of native coronary artery without angina pectoris: Secondary | ICD-10-CM | POA: Diagnosis not present

## 2023-05-02 DIAGNOSIS — I10 Essential (primary) hypertension: Secondary | ICD-10-CM | POA: Diagnosis not present

## 2023-05-02 DIAGNOSIS — E782 Mixed hyperlipidemia: Secondary | ICD-10-CM | POA: Diagnosis not present

## 2023-05-13 ENCOUNTER — Other Ambulatory Visit: Payer: Self-pay | Admitting: Pharmacist

## 2023-05-13 MED ORDER — ICOSAPENT ETHYL 1 G PO CAPS: 2.0000 g | ORAL_CAPSULE | Freq: Two times a day (BID) | ORAL | 3 refills | Status: DC

## 2023-06-10 ENCOUNTER — Telehealth: Payer: Self-pay | Admitting: Pharmacist

## 2023-06-10 ENCOUNTER — Encounter: Payer: Self-pay | Admitting: Pharmacist

## 2023-06-10 NOTE — Telephone Encounter (Signed)
Patient called stating that now that he is on medicare, his Vascepa cost $100 and wanted to know if there was anything cheaper. Looks like lovaza is the same cost. Previously on fenofibrate as well. In past TG were >400. Last labs showed TG <150 on just Vascepa, rosuvastatin and Repatha. Discussed CV benefit w/ Vascepa that hasn't been seen with fenofibrate or Lovaza. Was able to get healthwell grant.  CARD NO. [id number]   CARD STATUS Active   BIN [id number]   PCN [id number]   PC GROUP [id number]

## 2023-07-18 ENCOUNTER — Other Ambulatory Visit (HOSPITAL_COMMUNITY): Payer: Self-pay

## 2023-07-18 ENCOUNTER — Telehealth: Payer: Self-pay | Admitting: Pharmacy Technician

## 2023-07-18 NOTE — Telephone Encounter (Signed)
Pharmacy Patient Advocate Encounter   Received notification from CoverMyMeds that prior authorization for Repatha SureClick 140MG /ML auto-injectors is required/requested.   Insurance verification completed.   The patient is insured through [facility] .   Per test claim: PA submitted to [facility] via CoverMyMeds Key/confirmation #/EOC [facility] Status is pending

## 2023-07-18 NOTE — Telephone Encounter (Signed)
Pharmacy Patient Advocate Encounter  Received notification from [facility] that Prior Authorization for REPATHA has been APPROVED from 04/27/23 to 12/27/23.Marland Kitchen

## 2023-09-25 DIAGNOSIS — R69 Illness, unspecified: Secondary | ICD-10-CM | POA: Diagnosis not present

## 2023-10-20 ENCOUNTER — Ambulatory Visit: Payer: 59 | Admitting: Cardiology

## 2023-11-03 DIAGNOSIS — I10 Essential (primary) hypertension: Secondary | ICD-10-CM | POA: Diagnosis not present

## 2023-11-03 DIAGNOSIS — E782 Mixed hyperlipidemia: Secondary | ICD-10-CM | POA: Diagnosis not present

## 2023-11-03 DIAGNOSIS — Z79899 Other long term (current) drug therapy: Secondary | ICD-10-CM | POA: Diagnosis not present

## 2023-11-03 DIAGNOSIS — Z125 Encounter for screening for malignant neoplasm of prostate: Secondary | ICD-10-CM | POA: Diagnosis not present

## 2024-01-10 ENCOUNTER — Ambulatory Visit: Payer: 59 | Admitting: Cardiology

## 2024-01-10 DIAGNOSIS — L814 Other melanin hyperpigmentation: Secondary | ICD-10-CM | POA: Diagnosis not present

## 2024-01-10 DIAGNOSIS — L578 Other skin changes due to chronic exposure to nonionizing radiation: Secondary | ICD-10-CM | POA: Diagnosis not present

## 2024-01-10 DIAGNOSIS — L708 Other acne: Secondary | ICD-10-CM | POA: Diagnosis not present

## 2024-01-10 DIAGNOSIS — Z7189 Other specified counseling: Secondary | ICD-10-CM | POA: Diagnosis not present

## 2024-01-10 DIAGNOSIS — L84 Corns and callosities: Secondary | ICD-10-CM | POA: Diagnosis not present

## 2024-01-10 DIAGNOSIS — L821 Other seborrheic keratosis: Secondary | ICD-10-CM | POA: Diagnosis not present

## 2024-01-10 DIAGNOSIS — L570 Actinic keratosis: Secondary | ICD-10-CM | POA: Diagnosis not present

## 2024-01-11 ENCOUNTER — Telehealth: Payer: Self-pay | Admitting: Pharmacist

## 2024-01-11 MED ORDER — ICOSAPENT ETHYL 1 G PO CAPS: 2.0000 g | ORAL_CAPSULE | Freq: Two times a day (BID) | ORAL | 11 refills | Status: DC

## 2024-01-11 NOTE — Telephone Encounter (Signed)
 Call to request refill on Vascepa , prescription sent to preferred pharmacy in [facility]

## 2024-01-23 ENCOUNTER — Other Ambulatory Visit: Payer: Self-pay | Admitting: Pharmacist

## 2024-01-23 MED ORDER — REPATHA SURECLICK 140 MG/ML ~~LOC~~ SOAJ: 1.0000 mL | SUBCUTANEOUS | 11 refills | Status: DC

## 2024-02-13 ENCOUNTER — Other Ambulatory Visit: Payer: Self-pay

## 2024-02-13 MED ORDER — ROSUVASTATIN CALCIUM 5 MG PO TABS: 5.0000 mg | ORAL_TABLET | Freq: Every day | ORAL | 0 refills | Status: DC

## 2024-02-22 ENCOUNTER — Encounter: Payer: Self-pay | Admitting: Cardiology

## 2024-02-22 ENCOUNTER — Ambulatory Visit: Payer: Medicare HMO | Attending: Cardiology | Admitting: Cardiology

## 2024-02-22 VITALS — BP 134/76 | HR 73 | Ht 72.0 in | Wt 222.2 lb

## 2024-02-22 DIAGNOSIS — R011 Cardiac murmur, unspecified: Secondary | ICD-10-CM

## 2024-02-22 DIAGNOSIS — Z79899 Other long term (current) drug therapy: Secondary | ICD-10-CM

## 2024-02-22 DIAGNOSIS — I2510 Atherosclerotic heart disease of native coronary artery without angina pectoris: Secondary | ICD-10-CM | POA: Diagnosis not present

## 2024-02-22 DIAGNOSIS — E782 Mixed hyperlipidemia: Secondary | ICD-10-CM

## 2024-02-22 NOTE — Patient Instructions (Signed)
 Medication Instructions:  The current medical regimen is effective;  continue present plan and medications.  *If you need a refill on your cardiac medications before your next appointment, please call your pharmacy*  Testing/Procedures: Your physician has requested that you have an echocardiogram. Echocardiography is a painless test that uses sound waves to create images of your heart. It provides your doctor with information about the size and shape of your heart and how well your heart's chambers and valves are working. This procedure takes approximately one hour. There are no restrictions for this procedure. Please do NOT wear cologne, perfume, aftershave, or lotions (deodorant is allowed). Please arrive 15 minutes prior to your appointment time.  Please note: We ask at that you not bring children with you during ultrasound (echo/ vascular) testing. Due to room size and safety concerns, children are not allowed in the ultrasound rooms during exams. Our front office staff cannot provide observation of children in our lobby area while testing is being conducted. An adult accompanying a patient to their appointment will only be allowed in the ultrasound room at the discretion of the ultrasound technician under special circumstances. We apologize for any inconvenience.  Follow-Up: At [facility], you and your health needs are our priority.  As part of our continuing mission to provide you with exceptional heart care, we have created designated Provider Care Teams.  These Care Teams include your primary Cardiologist (physician) and Advanced Practice Providers (APPs -  Physician Assistants and Nurse Practitioners) who all work together to provide you with the care you need, when you need it.  We recommend signing up for the patient portal called "MyChart".  Sign up information is provided on this After Visit Summary.  MyChart is used to connect with patients for Virtual Visits (Telemedicine).   Patients are able to view lab/test results, encounter notes, upcoming appointments, etc.  Non-urgent messages can be sent to your provider as well.   To learn more about what you can do with MyChart, go to [email or web address].    Your next appointment:   1 year(s)  Provider:   Donato Schultz, MD       1st Floor: - Lobby - Registration  - Pharmacy  - Lab - Cafe  2nd Floor: - PV Lab - Diagnostic Testing (echo, CT, nuclear med)  3rd Floor: - Vacant  4th Floor: - TCTS (cardiothoracic surgery) - AFib Clinic - Structural Heart Clinic - Vascular Surgery  - Vascular Ultrasound  5th Floor: - HeartCare Cardiology (general and EP) - Clinical Pharmacy for coumadin, hypertension, lipid, weight-loss medications, and med management appointments    Valet parking services will be available as well.

## 2024-02-22 NOTE — Progress Notes (Signed)
 Cardiology Office Note:  .   Date:  02/22/2024  ID:  [name], DOB [birth year], MRN [id number] PCP: Thana Ates, MD  [facility] Providers Cardiologist:  Donato Schultz, MD    History of Present Illness: .   Travis Gomez is a 68 y.o. male Discussed the use of AI scribe software for clinical note transcription with the patient, who gave verbal consent to proceed.  History of Present Illness Travis Gomez is a 68 year old male with coronary artery disease, hyperlipidemia, and hypertriglyceridemia who presents for follow-up.  He experiences shortness of breath only with exertion, such as running or going uphill, and has no chest pain. He is currently on Repatha injections, which he tolerates well, and his LDL levels have been in the twenties. He has a history of statin intolerance, experiencing achiness and a viral-like syndrome on Crestor 10 mg, which resolved upon discontinuation. He is on Repatha 140 mg injection every 14 days and Vascepa 2 grams twice daily for hyperlipidemia. He is also on low-dose Crestor 5 mg daily, which is the maximally tolerated dose. For hypertension, he is taking lisinopril hydrochlorothiazide 20/25 mg daily. He reports a significant reduction in triglycerides from a baseline of 800 to 120, attributed to Vascepa.  His coronary calcium score was previously in the 99th percentile at 3800, with dense calcifications noted in the LAD and RCA. 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  His family history is significant for cardiovascular disease. His father died at age 19 from an aneurysm, his maternal grandfather had a heart attack at age 17, and his brother had CABG. His brother also had a two-vessel bypass and his sister had a valve replacement with a bovine valve. His maternal side has a history of early heart disease, with his grandfather and uncle both dying at age 23 from heart-related  issues.  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 He reports a healthy diet, avoiding fast food and fried foods, but acknowledges a lack of exercise, often opting for a golf cart instead of walking.       Studies Reviewed: Marland Kitchen   EKG Interpretation Date/Time:  Wednesday February 22 2024 08:43:55 EST Ventricular Rate:  73 PR Interval:  186 QRS Duration:  98 QT Interval:  384 QTC Calculation: 423 R Axis:   56  Text Interpretation: Normal sinus rhythm Incomplete right bundle branch block When compared with ECG of 16-Aug-2018 06:49, No significant change since last tracing Confirmed by Donato Schultz ([id number]) on 02/22/2024 8:49:36 AM    Results LABS LDL: 27 (11/03/2023) Triglycerides: 120 (11/03/2023)  RADIOLOGY Coronary calcium score: 3800, dense calcifications in LAD and RCA  DIAGNOSTIC Echocardiogram: Ejection fraction 65%, 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 (2021) EKG: Normal Risk Assessment/Calculations:            Physical Exam:   VS:  BP 134/76   Pulse 73   Ht 6' (1.829 m)   Wt 222 lb 3.2 oz (100.8 kg)   SpO2 98%   BMI 30.14 kg/m    Wt Readings from Last 3 Encounters:  02/22/24 222 lb 3.2 oz (100.8 kg)  01/03/23 226 lb (102.5 kg)  07/30/21 228 lb (103.4 kg)    GEN: Well nourished, well developed in no acute distress NECK: No JVD; No carotid bruits CARDIAC: RRR, 1/6 SM, no rubs,  no gallops RESPIRATORY:  Clear to auscultation without rales, wheezing or rhonchi  ABDOMEN: Soft, non-tender, non-distended EXTREMITIES:  No edema; No deformity   ASSESSMENT AND PLAN: .    Assessment and Plan Assessment & Plan Coronary Artery Disease Follow-up for coronary artery disease with dense calcifications in the LAD and RCA. Coronary calcium score in the 99th percentile at 3800. No chest pain or dyspnea. LDL levels well-controlled with Repatha, currently in the twenties. Triglycerides improved  from 800 to 120 with Vascepa. Ejection fraction is normal at 65% per 2021 echocardiogram. 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 Discussed that calcified plaque does not dissolve but Repatha and Crestor can reduce soft plaque, which is more likely to cause myocardial infarctions. Goal is to prevent major heart attacks by stabilizing soft plaque. - Continue Repatha 140 mg injection every 14 days - Continue Vascepa 2 grams twice daily - Continue low-dose Crestor 5 mg daily - Order echocardiogram to monitor calcification and valve function  Hyperlipidemia Hyperlipidemia managed with Repatha and low-dose Crestor. LDL levels excellently controlled, currently in the twenties. Discussed importance of maintaining LDL levels below 50 to prevent cardiovascular events. - Continue Repatha 140 mg injection every 14 days - Continue low-dose Crestor 5 mg daily  Hypertriglyceridemia Hypertriglyceridemia with baseline triglycerides of 800, now significantly improved to 120 with Vascepa. Discussed significant reduction in triglycerides and role of Vascepa. - Continue Vascepa 2 grams twice daily  Hypertension Hypertension managed with lisinopril-hydrochlorothiazide 20/25 mg daily. - Continue lisinopril-hydrochlorothiazide 20/25 mg daily  General Health Maintenance Encouraged to walk 30 minutes daily to improve cardiovascular health. - Encourage 30 minutes of walking daily  Follow-up - Schedule annual follow-up visit - Order echocardiogram in approximately two months.          Signed, Donato Schultz, MD

## 2024-04-26 ENCOUNTER — Ambulatory Visit (HOSPITAL_COMMUNITY): Payer: Medicare HMO | Attending: Cardiology

## 2024-04-26 DIAGNOSIS — R011 Cardiac murmur, unspecified: Secondary | ICD-10-CM | POA: Insufficient documentation

## 2024-04-26 LAB — ECHOCARDIOGRAM COMPLETE
Area-P 1/2: 3.37 cm2
S' Lateral: 2.7 cm

## 2024-05-01 ENCOUNTER — Encounter: Payer: Self-pay | Admitting: Cardiology

## 2024-05-03 DIAGNOSIS — Z23 Encounter for immunization: Secondary | ICD-10-CM | POA: Diagnosis not present

## 2024-05-03 DIAGNOSIS — I10 Essential (primary) hypertension: Secondary | ICD-10-CM | POA: Diagnosis not present

## 2024-05-03 DIAGNOSIS — I2510 Atherosclerotic heart disease of native coronary artery without angina pectoris: Secondary | ICD-10-CM | POA: Diagnosis not present

## 2024-05-03 DIAGNOSIS — J302 Other seasonal allergic rhinitis: Secondary | ICD-10-CM | POA: Diagnosis not present

## 2024-05-15 DIAGNOSIS — H2513 Age-related nuclear cataract, bilateral: Secondary | ICD-10-CM | POA: Diagnosis not present

## 2024-05-15 DIAGNOSIS — H1045 Other chronic allergic conjunctivitis: Secondary | ICD-10-CM | POA: Diagnosis not present

## 2024-05-23 ENCOUNTER — Other Ambulatory Visit: Payer: Self-pay

## 2024-05-23 ENCOUNTER — Other Ambulatory Visit (HOSPITAL_COMMUNITY): Payer: Self-pay

## 2024-05-23 ENCOUNTER — Telehealth: Payer: Self-pay | Admitting: Cardiology

## 2024-05-23 ENCOUNTER — Telehealth: Payer: Self-pay | Admitting: Pharmacy Technician

## 2024-05-23 MED ORDER — ROSUVASTATIN CALCIUM 5 MG PO TABS: 5.0000 mg | ORAL_TABLET | Freq: Every day | ORAL | 3 refills | Status: AC

## 2024-05-23 NOTE — Telephone Encounter (Signed)
 Patient Advocate Encounter   The patient was approved for a [employer] grant that will help cover the cost of Repatha  Total amount awarded, 2500.00- .  Effective: 05/10/24 - 05/09/25   ZOX:[id number] [id number] WGNFA:[id number] [id number]  [employer] ID: [id number]   Pharmacy provided with approval and processing information. Patient informed via mychart

## 2024-05-23 NOTE — Telephone Encounter (Signed)
 Pt called in stating his healthwell grant for his medications has expired and needs to be renewed. Please advise.

## 2024-05-24 ENCOUNTER — Other Ambulatory Visit (HOSPITAL_COMMUNITY): Payer: Self-pay

## 2024-11-05 DIAGNOSIS — Z79899 Other long term (current) drug therapy: Secondary | ICD-10-CM | POA: Diagnosis not present

## 2024-11-05 DIAGNOSIS — Z125 Encounter for screening for malignant neoplasm of prostate: Secondary | ICD-10-CM | POA: Diagnosis not present

## 2024-11-05 DIAGNOSIS — E782 Mixed hyperlipidemia: Secondary | ICD-10-CM | POA: Diagnosis not present

## 2024-11-08 DIAGNOSIS — L814 Other melanin hyperpigmentation: Secondary | ICD-10-CM | POA: Diagnosis not present

## 2024-11-08 DIAGNOSIS — L820 Inflamed seborrheic keratosis: Secondary | ICD-10-CM | POA: Diagnosis not present

## 2024-11-08 DIAGNOSIS — L821 Other seborrheic keratosis: Secondary | ICD-10-CM | POA: Diagnosis not present

## 2024-11-08 DIAGNOSIS — L570 Actinic keratosis: Secondary | ICD-10-CM | POA: Diagnosis not present

## 2024-11-08 DIAGNOSIS — D2261 Melanocytic nevi of right upper limb, including shoulder: Secondary | ICD-10-CM | POA: Diagnosis not present

## 2024-11-08 DIAGNOSIS — D171 Benign lipomatous neoplasm of skin and subcutaneous tissue of trunk: Secondary | ICD-10-CM | POA: Diagnosis not present

## 2024-11-08 DIAGNOSIS — L918 Other hypertrophic disorders of the skin: Secondary | ICD-10-CM | POA: Diagnosis not present

## 2024-12-17 ENCOUNTER — Other Ambulatory Visit: Payer: Self-pay

## 2024-12-17 MED ORDER — ICOSAPENT ETHYL 1 G PO CAPS: 2.0000 g | ORAL_CAPSULE | Freq: Two times a day (BID) | ORAL | 11 refills | Status: AC

## 2024-12-17 MED ORDER — REPATHA SURECLICK 140 MG/ML ~~LOC~~ SOAJ: 1.0000 mL | SUBCUTANEOUS | 11 refills | Status: AC
